# Patient Record
Sex: Male | Born: 1950 | Race: White | Hispanic: No | Marital: Married | Smoking: Current some day smoker
Health system: Southern US, Community
[De-identification: ages and names within clinical notes are randomized; demographics above are authoritative.]

## PROBLEM LIST (undated history)

## (undated) DIAGNOSIS — G4730 Sleep apnea, unspecified: Secondary | ICD-10-CM

## (undated) DIAGNOSIS — I10 Essential (primary) hypertension: Secondary | ICD-10-CM

## (undated) DIAGNOSIS — G4733 Obstructive sleep apnea (adult) (pediatric): Secondary | ICD-10-CM

## (undated) DIAGNOSIS — D126 Benign neoplasm of colon, unspecified: Secondary | ICD-10-CM

## (undated) DIAGNOSIS — D696 Thrombocytopenia, unspecified: Secondary | ICD-10-CM

## (undated) HISTORY — DX: Benign neoplasm of colon, unspecified: D12.6

## (undated) HISTORY — DX: Sleep apnea, unspecified: G47.30

## (undated) HISTORY — DX: Thrombocytopenia, unspecified: D69.6

## (undated) HISTORY — PX: MASTOIDECTOMY: SHX711

## (undated) HISTORY — DX: Essential (primary) hypertension: I10

## (undated) HISTORY — PX: KNEE ARTHROSCOPY: SUR90

## (undated) HISTORY — DX: Obstructive sleep apnea (adult) (pediatric): G47.33

## (undated) HISTORY — PX: TYMPANOSTOMY TUBE PLACEMENT: SHX32

---

## 1996-12-04 ENCOUNTER — Encounter: Payer: Self-pay | Admitting: Internal Medicine

## 1996-12-04 LAB — CONVERTED CEMR LAB: TSH: 1.3 microintl units/mL

## 1996-12-05 ENCOUNTER — Encounter: Payer: Self-pay | Admitting: Internal Medicine

## 1999-10-01 ENCOUNTER — Encounter: Payer: Self-pay | Admitting: Internal Medicine

## 1999-10-01 LAB — CONVERTED CEMR LAB: Blood Glucose, Fasting: 95 mg/dL

## 2001-02-23 ENCOUNTER — Encounter: Payer: Self-pay | Admitting: Internal Medicine

## 2002-09-11 ENCOUNTER — Encounter: Payer: Self-pay | Admitting: Internal Medicine

## 2004-01-22 ENCOUNTER — Encounter: Payer: Self-pay | Admitting: Internal Medicine

## 2004-01-22 LAB — CONVERTED CEMR LAB
Blood Glucose, Fasting: 101 mg/dL
TSH: 1.11 microintl units/mL

## 2004-03-03 ENCOUNTER — Encounter: Admission: RE | Admit: 2004-03-03 | Discharge: 2004-03-03 | Payer: Self-pay | Admitting: Cardiology

## 2005-04-23 IMAGING — CT CT ANGIO ABDOMEN
1 of 6 series · 12 of 32 positions shown, 18 images · IV contrast (150 ML OMNI 300)
Comparison: none

CLINICAL DATA: Malignant hypertension.  Con ? none. 
 CT ANGIO ABDOMEN WITH AND WITHOUT CONTRAST
 Comparison ? none.
TECHNIQUE: After the intravenous injection of 150 cc Omnipaque 350, 1 mm spiral images were obtained through the abdomen.  Noncontrasted 5 mm sections through the abdomen were also obtained.  Images were reconstructed in multiplanar views.

[Series 5: recon 2: renal/sma cta · axial · 0.55mm/px · z∈[-274,-19]mm · 12 of 485 slices shown, 18 images]
[im 38/485  soft-tissue]
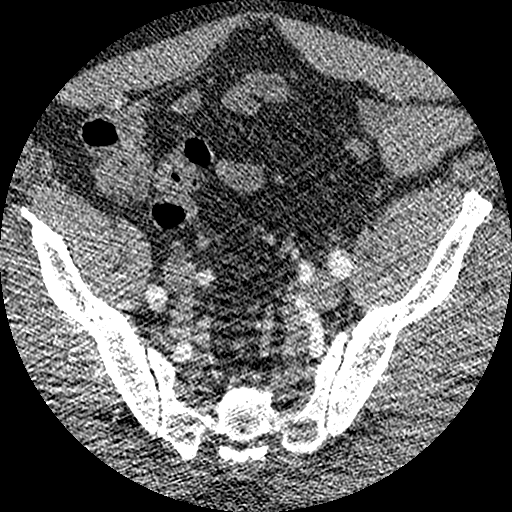
[im 38/485  bone]
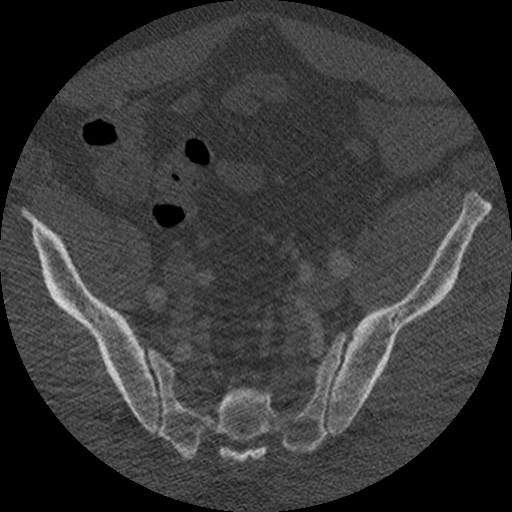
[im 75/485  soft-tissue]
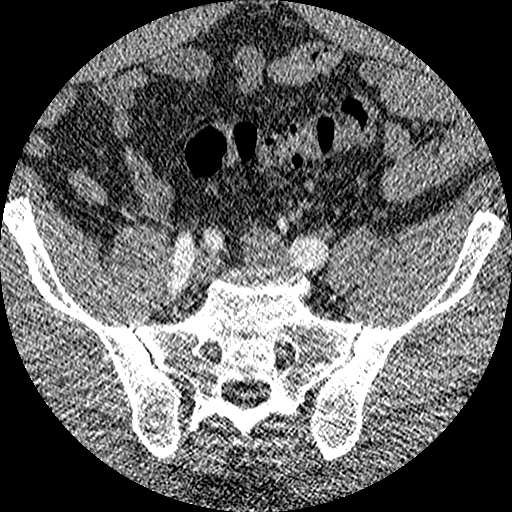
[im 112/485  soft-tissue]
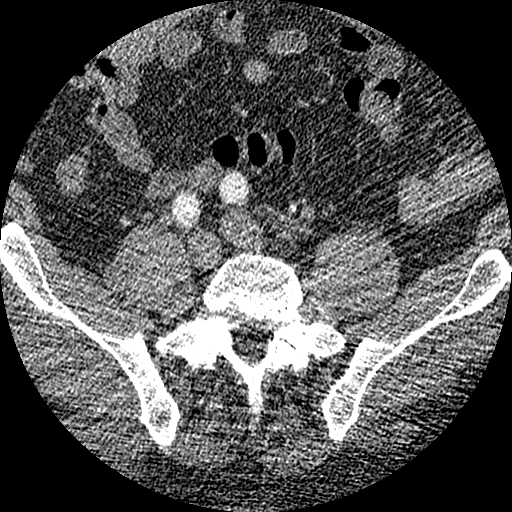
[im 149/485  soft-tissue]
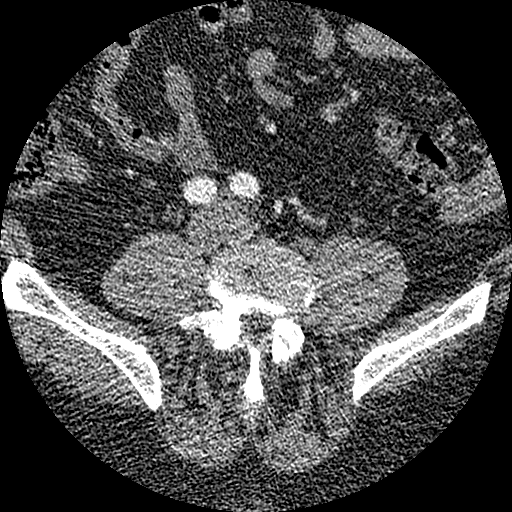
[im 187/485  soft-tissue]
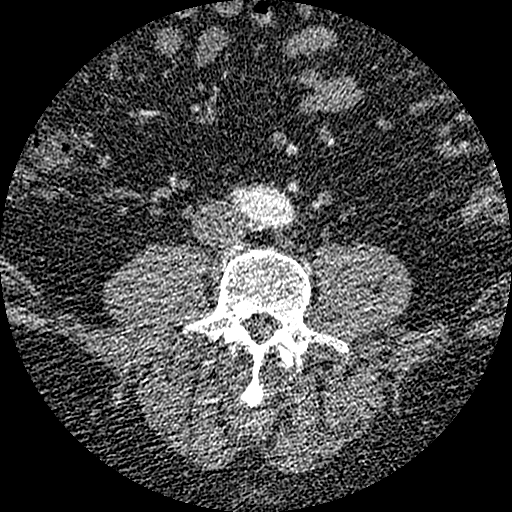
[im 224/485  soft-tissue]
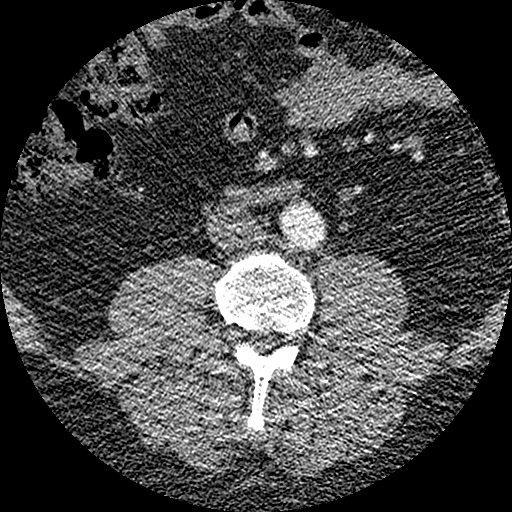
[im 261/485  soft-tissue]
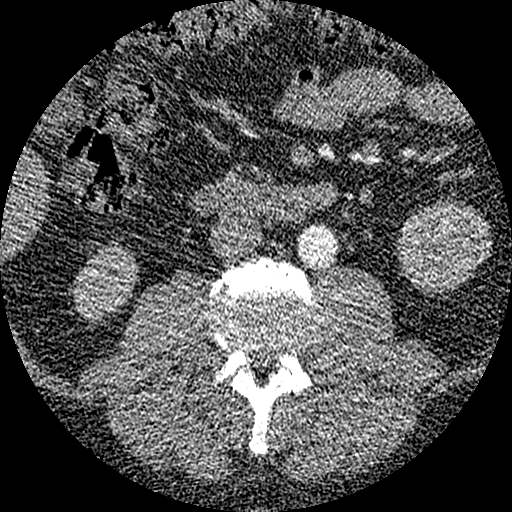
[im 298/485  soft-tissue]
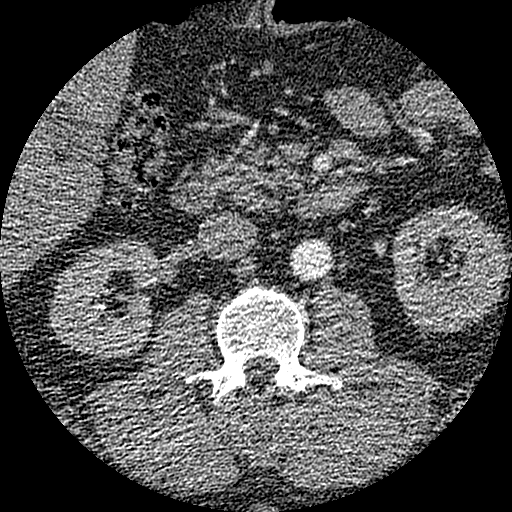
[im 336/485  soft-tissue]
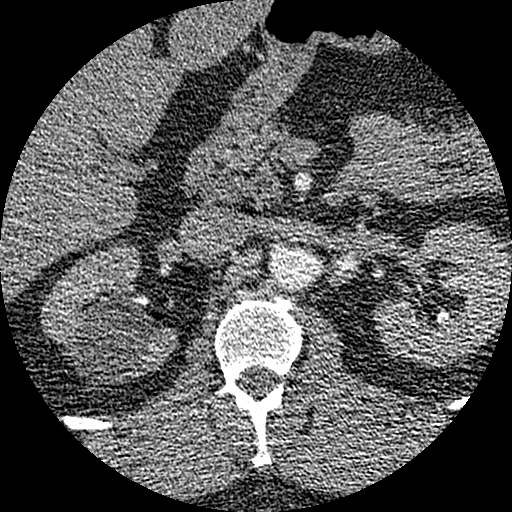
[im 336/485  lung]
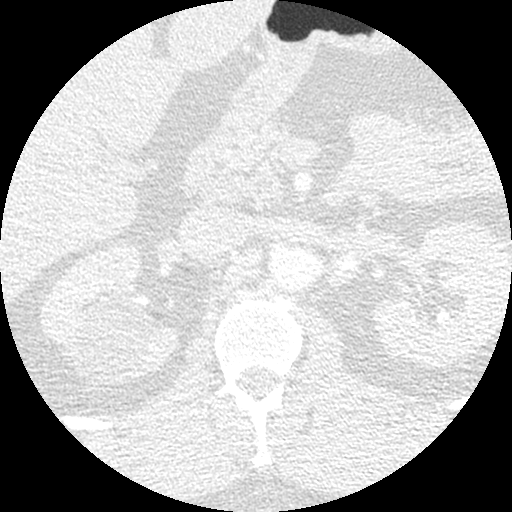
[im 336/485  bone]
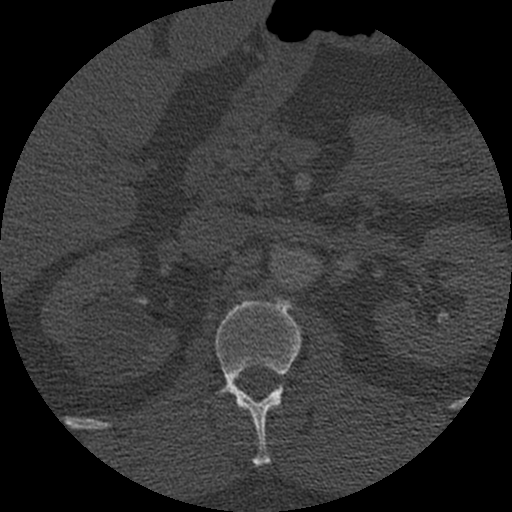
[im 373/485  soft-tissue]
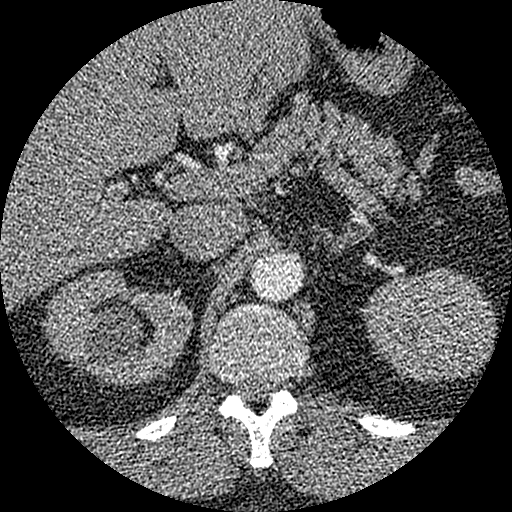
[im 373/485  lung]
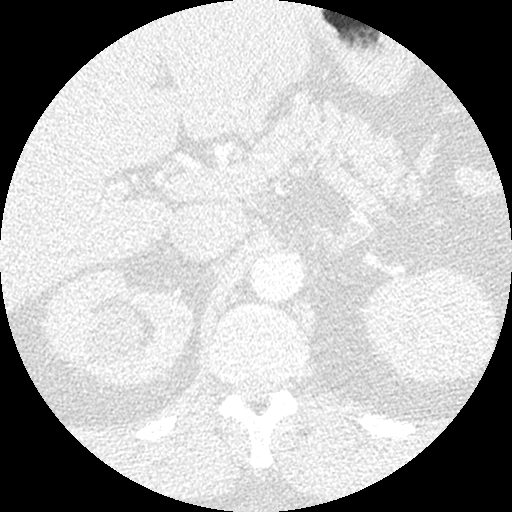
[im 410/485  soft-tissue]
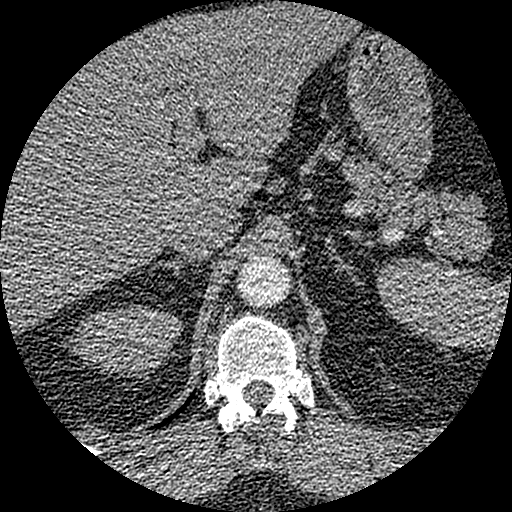
[im 410/485  lung]
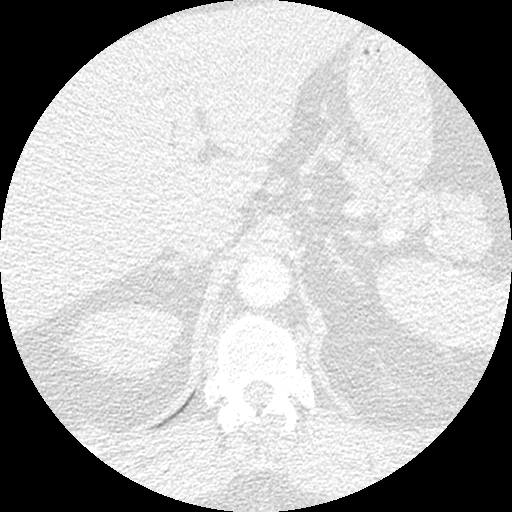
[im 447/485  soft-tissue]
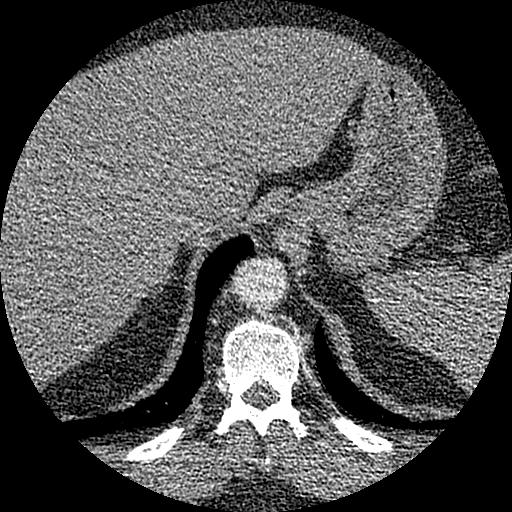
[im 447/485  lung]
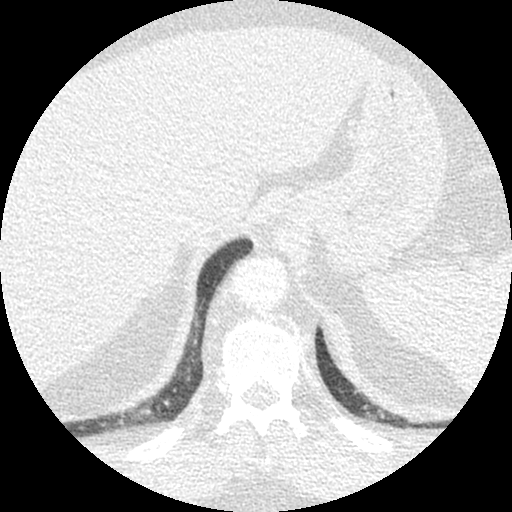

[12 of 32 positions shown; findings below may reference images not displayed]

FINDINGS: Single renal arteries are widely patent bilaterally.  The abdominal aorta is tortuous secondary to hypertension.  The celiac axis, SMA and IMA are patent.  The iliac arteries are patent.  
 Hypodensities are seen in both kidneys.  The largest is in the interpolar region of the right kidney measuring 5 x 4.7 cm.  This is compatible with a simple cyst.  Other hypodensities are too small to characterize.  The gallbladder, liver, spleen, pancreas and adrenal glands are within normal limits.  Borderline periportal lymph nodes are present likely inflammatory.  The appendix is normal.  Negative free fluid.  An umbilical hernia is present containing only peritoneal fat.  
 IMPRESSION
 1.  No evidence of significant renal artery stenosis to cause renovascular hypertension.
 2.  Renal hypodensities.  The largest is a 5 cm simple cyst in the interpolar region of the right kidney.
 3.  Small umbilical hernia containing only fat.

## 2005-06-11 ENCOUNTER — Ambulatory Visit: Payer: Self-pay | Admitting: Internal Medicine

## 2005-06-25 ENCOUNTER — Ambulatory Visit: Payer: Self-pay | Admitting: Internal Medicine

## 2005-06-25 LAB — CONVERTED CEMR LAB: Blood Glucose, Fasting: 112 mg/dL

## 2005-07-23 ENCOUNTER — Ambulatory Visit: Payer: Self-pay | Admitting: Internal Medicine

## 2005-09-08 ENCOUNTER — Ambulatory Visit: Payer: Self-pay | Admitting: Internal Medicine

## 2005-10-28 ENCOUNTER — Ambulatory Visit: Payer: Self-pay | Admitting: Internal Medicine

## 2005-10-28 LAB — CONVERTED CEMR LAB
Blood Glucose, Fasting: 106 mg/dL
PSA: 0.4 ng/mL

## 2007-08-19 ENCOUNTER — Ambulatory Visit: Payer: Self-pay | Admitting: Internal Medicine

## 2007-08-19 DIAGNOSIS — G4733 Obstructive sleep apnea (adult) (pediatric): Secondary | ICD-10-CM | POA: Insufficient documentation

## 2007-08-19 DIAGNOSIS — I10 Essential (primary) hypertension: Secondary | ICD-10-CM

## 2007-08-19 DIAGNOSIS — J309 Allergic rhinitis, unspecified: Secondary | ICD-10-CM | POA: Insufficient documentation

## 2007-08-19 LAB — CONVERTED CEMR LAB
BUN: 20 mg/dL (ref 6–23)
Chloride: 105 meq/L (ref 96–112)
Creatinine, Ser: 1.1 mg/dL (ref 0.4–1.5)
GFR calc non Af Amer: 74 mL/min
Glucose, Bld: 108 mg/dL — ABNORMAL HIGH (ref 70–99)
Phosphorus: 3.7 mg/dL (ref 2.3–4.6)
Potassium: 4.6 meq/L (ref 3.5–5.1)
Sodium: 138 meq/L (ref 135–145)

## 2007-09-12 ENCOUNTER — Ambulatory Visit: Payer: Self-pay | Admitting: Internal Medicine

## 2007-09-27 ENCOUNTER — Encounter: Payer: Self-pay | Admitting: Internal Medicine

## 2007-09-27 ENCOUNTER — Ambulatory Visit: Payer: Self-pay | Admitting: Internal Medicine

## 2008-12-20 ENCOUNTER — Telehealth: Payer: Self-pay | Admitting: Internal Medicine

## 2009-01-25 ENCOUNTER — Telehealth (INDEPENDENT_AMBULATORY_CARE_PROVIDER_SITE_OTHER): Payer: Self-pay | Admitting: *Deleted

## 2009-02-06 ENCOUNTER — Ambulatory Visit: Payer: Self-pay | Admitting: Internal Medicine

## 2009-02-08 LAB — CONVERTED CEMR LAB
AST: 27 units/L (ref 0–37)
Albumin: 3.8 g/dL (ref 3.5–5.2)
Alkaline Phosphatase: 51 units/L (ref 39–117)
BUN: 17 mg/dL (ref 6–23)
Basophils Absolute: 0 10*3/uL (ref 0.0–0.1)
Calcium: 9.2 mg/dL (ref 8.4–10.5)
Eosinophils Absolute: 0.1 10*3/uL (ref 0.0–0.7)
Eosinophils Relative: 1.3 % (ref 0.0–5.0)
Glucose, Bld: 92 mg/dL (ref 70–99)
HCT: 41 % (ref 39.0–52.0)
Lymphs Abs: 1.3 10*3/uL (ref 0.7–4.0)
MCHC: 33.6 g/dL (ref 30.0–36.0)
MCV: 91.9 fL (ref 78.0–100.0)
Monocytes Absolute: 0.6 10*3/uL (ref 0.1–1.0)
Monocytes Relative: 7.5 % (ref 3.0–12.0)
PSA: 0.46 ng/mL (ref 0.10–4.00)
Phosphorus: 2.5 mg/dL (ref 2.3–4.6)
Potassium: 4.5 meq/L (ref 3.5–5.1)
TSH: 0.99 microintl units/mL (ref 0.35–5.50)
Total Bilirubin: 0.7 mg/dL (ref 0.3–1.2)
Total Protein: 6.8 g/dL (ref 6.0–8.3)
WBC: 8.6 10*3/uL (ref 4.5–10.5)

## 2009-03-31 ENCOUNTER — Emergency Department: Payer: Self-pay | Admitting: Emergency Medicine

## 2009-05-07 ENCOUNTER — Telehealth: Payer: Self-pay | Admitting: Internal Medicine

## 2010-01-01 ENCOUNTER — Telehealth: Payer: Self-pay | Admitting: Internal Medicine

## 2010-01-06 ENCOUNTER — Encounter (INDEPENDENT_AMBULATORY_CARE_PROVIDER_SITE_OTHER): Payer: Self-pay | Admitting: *Deleted

## 2010-04-09 ENCOUNTER — Telehealth: Payer: Self-pay | Admitting: Internal Medicine

## 2010-06-16 ENCOUNTER — Ambulatory Visit: Payer: Self-pay | Admitting: Internal Medicine

## 2010-06-16 DIAGNOSIS — L570 Actinic keratosis: Secondary | ICD-10-CM | POA: Insufficient documentation

## 2010-06-17 LAB — CONVERTED CEMR LAB
ALT: 37 units/L (ref 0–53)
AST: 27 units/L (ref 0–37)
Basophils Absolute: 0 10*3/uL (ref 0.0–0.1)
Bilirubin, Direct: 0.1 mg/dL (ref 0.0–0.3)
GFR calc non Af Amer: 77.01 mL/min (ref >60)
Glucose, Bld: 98 mg/dL (ref 70–99)
HCT: 39.4 % (ref 39.0–52.0)
MCHC: 34.6 g/dL (ref 30.0–36.0)
MCV: 91.6 fL (ref 78.0–100.0)
Monocytes Absolute: 0.7 10*3/uL (ref 0.1–1.0)
Monocytes Relative: 13.4 % — ABNORMAL HIGH (ref 3.0–12.0)
Neutro Abs: 3 10*3/uL (ref 1.4–7.7)
Phosphorus: 2.3 mg/dL (ref 2.3–4.6)
RBC: 4.3 M/uL (ref 4.22–5.81)
TSH: 0.92 microintl units/mL (ref 0.35–5.50)
Total Bilirubin: 0.6 mg/dL (ref 0.3–1.2)
Total Protein: 6.5 g/dL (ref 6.0–8.3)
WBC: 5.1 10*3/uL (ref 4.5–10.5)

## 2010-10-28 NOTE — Progress Notes (Signed)
Summary: regarding diltiazem  Phone Note Call from Patient Call back at [phone]   Caller: Spouse Call For: Cindee Salt MD Summary of Call: Pt's insurance has changed and is now requiring 90 day scripts.  They wont cover diltiazem XT 180 mg's that the pt is now taking but they will cover plain diltiazem 90 mg's.  Wife is asking if this can be changed and the pt can take 2 two times a day.  If so, please send 90 day supply to [facility]. Initial call taken by: Lowella Petties CMA,  January 01, 2010 12:29 PM  Follow-up for Phone Call        okay to change to non ER and change to 90mg  two times a day   Okay to send #90 x 1 Needs PE  by 4-6 months--he is actually due in May Follow-up by: Cindee Salt MD,  January 01, 2010 1:54 PM  Additional Follow-up for Phone Call Additional follow up Details #1::        Left message at spouses' job for her to call back regarding Rx and appt.  Linde Gillis CMA [facility] Dull)  January 01, 2010 4:13 PM     New/Updated Medications: CARDIZEM 90 MG TABS (DILTIAZEM HCL) take one tablet by mouth twice daily Prescriptions: CARDIZEM 90 MG TABS (DILTIAZEM HCL) take one tablet by mouth twice daily  #90 x 1   Entered by:   Linde Gillis CMA (AAMA)   Authorized by:   Cindee Salt MD   Signed by:   Linde Gillis CMA (AAMA) on 01/02/2010   Method used:   Historical   RxID:   [id number] CARTIA XT 180 MG CP24 (DILTIAZEM HCL COATED BEADS) Take 1 tablet by mouth two times a day  #90 x 1   Entered by:   Linde Gillis CMA (AAMA)   Authorized by:   Cindee Salt MD   Signed by:   Linde Gillis CMA (AAMA) on 01/02/2010   Method used:   Electron[facility] to        [id number]       [address]       [place], [place]  [address]       Ph: [phone]       Fax: [phone]   RxID:   [id number]  [facility]ed [facility] [facility] to [facility] this Rx for Diltiazem 180mg , left message on voicemail of correct Rx, Diltiazem 90mg .  Linde Gillis CMA Duncan Dull)  January 02, 2010 11:46 AM Current Allergies (reviewed today): ! * BEE STINGS  Appended Document: regarding diltiazem Patient's spouse called back and I advised her as instructed.  She will have her husband call back to schedule physical when he gets back in town.    Appended Document: regarding diltiazem Diltiazem called to pharmacy as 90 mg, 2 tabs two times a day, quantity changed to 360 with one refill, changed in emr.

## 2010-10-28 NOTE — Assessment & Plan Note (Signed)
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 * Bee Stings  Past History:  Past medical, surgical, family and social histories (including risk factors) reviewed for relevance to current acute and chronic problems.  Past Medical History: Reviewed history from 08/19/2007 and no changes required. Allergic rhinitis Hypertension Obstructive sleep apnea  Past Surgical History: Reviewed history from 08/19/2007 and no changes required. 1963 Ear tubes and mastoid surgery x 4 1991 Cyst left temporal area 1993 Arthroscopy left knee  Family History: Reviewed history from 08/19/2007 and no changes required. Dad with circulation problems --had JRA. Now with gout Mom with osteoarthritis No CAD, HTN, DM No prostate or colon cancer  Social History: Reviewed history from 08/19/2007 and no changes required. 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   Neck:  supple, no masses, no thyromegaly, no carotid bruits, and no cervical lymphadenopathy.   Lungs:  normal respiratory effort, no intercostal retractions, no accessory muscle use, and normal breath sounds.   Heart:  normal rate, regular rhythm, no murmur, and no gallop.   Extremities:  no sig edema Skin:  actiinic on left forearm small skin tags Psych:  normally interactive, good eye contact, not anxious appearing, and not depressed appearing.     Impression & Recommendations:  Problem # 1:  HYPERTENSION (ICD-401.9) Assessment Improved  BP some better with weight loss no changes needed  The following medications were removed from the medication list:    Diltiazem Hcl 90 Mg Tabs (Diltiazem hcl) .Marland Kitchen... Take 2 tabs by mouth two times a day His updated medication list for this problem includes:    Cardizem 90 Mg Tabs (Diltiazem hcl) .Marland Kitchen... Take one tablet by mouth twice daily    Doxazosin Mesylate 8 Mg Tabs (Doxazosin mesylate) .Marland Kitchen... Take 1 tablet by mouth once a day    Atenolol 100 Mg Tabs (Atenolol) .Marland Kitchen... Take 1 tablet by mouth once a day    Lisinopril-hydrochlorothiazide 20-12.5 Mg Tabs (Lisinopril-hydrochlorothiazide) .Marland Kitchen... Take 1 tablet by mouth two times a day  BP today: 130/80 Prior BP: 140/90 (02/06/2009)  Labs Reviewed: K+: 4.5 (02/06/2009) Creat: : 1.1 (02/06/2009)     Orders: TLB-Renal Function Panel (  80069-RENAL) TLB-CBC Platelet - w/Differential (85025-CBCD) TLB-Hepatic/Liver Function Pnl (80076-HEPATIC) TLB-TSH (Thyroid Stimulating Hormone) (84443-TSH) Venipuncture (09811)  Problem # 2:  ACTINIC KERATOSIS (ICD-702.0) Assessment: New  liquid nitrogen Rx 40 seconds x 2  Orders: Cryotherapy/Destruction benign or premalignant lesion (1st lesion)  (17000)  Complete Medication List: 1)  Cardizem 90 Mg Tabs (Diltiazem hcl) .... Take one tablet by mouth twice daily 2)  Doxazosin Mesylate 8 Mg Tabs (Doxazosin  mesylate) .... Take 1 tablet by mouth once a day 3)  Atenolol 100 Mg Tabs (Atenolol) .... Take 1 tablet by mouth once a day 4)  Lisinopril-hydrochlorothiazide 20-12.5 Mg Tabs (Lisinopril-hydrochlorothiazide) .... Take 1 tablet by mouth two times a day 5)  Epipen 2-pak 0.3 Mg/0.24ml (1:1000) Devi (Epinephrine hcl (anaphylaxis)) .... As needed bee stings 6)  Cpap  .... Used nightly  Other Orders: TLB-PSA (Prostate Specific Antigen) (84153-PSA)  Patient Instructions: 1)  Please schedule a follow-up appointment in 1 year for physical Prescriptions: LISINOPRIL-HYDROCHLOROTHIAZIDE 20-12.5 MG TABS (LISINOPRIL-HYDROCHLOROTHIAZIDE) Take 1 tablet by mouth two times a day  #180 Tablet x 3   Entered and Authorized by:   Cindee Salt MD   Signed by:   Cindee Salt MD on 06/16/2010   Method used:   Electronically to        [id number]       [address]       [place], [place]  [address]       Ph: [phone]       Fax: [phone]   RxID:   [id number] ATENOLOL 100 MG TABS (ATENOLOL) Take 1 tablet by mouth once a day  #90 Tablet x 3   Entered and Authorized by:   Cindee Salt MD   Signed by:   Cindee Salt MD on 06/16/2010   Method used:   Electronically to        [id number]       [address]       [place], [place]  [address]       Ph: [phone]       Fax: [phone]   RxID:   [id number] DOXAZOSIN MESYLATE 8 MG TABS (DOXAZOSIN MESYLATE) Take 1 tablet by mouth once a day  #90 Tablet x 3   Entered and Authorized by:   Cindee Salt MD   Signed by:   Cindee Salt MD on 06/16/2010   Method used:   Electronically to        [id number]       [address]       [place], [place]  [address]       Ph: [phone]       Fax: [phone]   RxID:   [id number] CARDIZEM 90 MG TABS (DILTIAZEM HCL) take one tablet by mouth twice daily  #90 x 3   Entered and Authorized  by:   Cindee Salt MD   Signed by:   Cindee Salt MD on 06/16/2010   Method used:   Electronically to        [id number]       [address]       [place], [place]  [address]       Ph: [phone]       Fax: [phone]   RxID:   [id number]   Current Allergies (reviewed today): ! * BEE STINGS

## 2010-10-28 NOTE — Miscellaneous (Signed)
Summary: med list update- diltiazem  Medications Added DILTIAZEM HCL 90 MG TABS (DILTIAZEM HCL) take 2 tabs by mouth two times a day       Clinical Lists Changes  Medications: Changed medication from CARTIA XT 180 MG CP24 (DILTIAZEM HCL COATED BEADS) Take 1 tablet by mouth two times a day to DILTIAZEM HCL 90 MG TABS (DILTIAZEM HCL) take 2 tabs by mouth two times a day     Prior Medications: DOXAZOSIN MESYLATE 8 MG TABS (DOXAZOSIN MESYLATE) Take 1 tablet by mouth once a day DILTIAZEM HCL 90 MG TABS (DILTIAZEM HCL) take 2 tabs by mouth two times a day ATENOLOL 100 MG TABS (ATENOLOL) Take 1 tablet by mouth once a day LISINOPRIL-HYDROCHLOROTHIAZIDE 20-12.5 MG TABS (LISINOPRIL-HYDROCHLOROTHIAZIDE) Take 1 tablet by mouth two times a day CPAP () USED NIGHTLY EPIPEN 2-PAK 0.3 MG/0.3ML (1:1000)  DEVI (EPINEPHRINE HCL (ANAPHYLAXIS)) as needed BEE STINGS AMOXICILLIN-POT CLAVULANATE 875-125 MG TABS (AMOXICILLIN-POT CLAVULANATE) 1 two times a day with food for infection METHOCARBAMOL 750 MG TABS (METHOCARBAMOL) 1 tab three times a day as needed for muscle spasm CARDIZEM 90 MG TABS (DILTIAZEM HCL) take one tablet by mouth twice daily Current Allergies: ! * BEE STINGS

## 2010-10-28 NOTE — Progress Notes (Signed)
Summary: Needs appt  Phone Note Outgoing Call Call back at Home Phone [phone]   Call placed by: DeShannon Katrinka Blazing CMA Duncan Dull),  April 09, 2010 9:18 AM Call placed to: Patient Summary of Call: Calling pt to advised that I did his refills for 3 months and within that time he needs to schedule a follow-up visit, pt was last seen 02/06/2009 and on that visit he was advised by Dr. Alphonsus Sias to follow-up in 1 year. Initial call taken by: Mervin Hack CMA Duncan Dull),  April 09, 2010 9:23 AM  Follow-up for Phone Call        left message on machine that patient needs to schedule appt. Follow-up by: Mervin Hack CMA Duncan Dull),  April 09, 2010 9:24 AM

## 2011-01-12 ENCOUNTER — Other Ambulatory Visit: Payer: Self-pay | Admitting: Internal Medicine

## 2011-07-27 ENCOUNTER — Other Ambulatory Visit: Payer: Self-pay | Admitting: Internal Medicine

## 2011-11-30 ENCOUNTER — Other Ambulatory Visit: Payer: Self-pay | Admitting: *Deleted

## 2011-11-30 MED ORDER — DILTIAZEM HCL 90 MG PO TABS: 90.0000 mg | ORAL_TABLET | Freq: Two times a day (BID) | ORAL | Status: DC

## 2011-11-30 MED ORDER — LISINOPRIL-HYDROCHLOROTHIAZIDE 20-12.5 MG PO TABS: 1.0000 | ORAL_TABLET | Freq: Two times a day (BID) | ORAL | Status: DC

## 2011-11-30 MED ORDER — ATENOLOL 100 MG PO TABS: 100.0000 mg | ORAL_TABLET | Freq: Every day | ORAL | Status: DC

## 2011-11-30 MED ORDER — DOXAZOSIN MESYLATE 8 MG PO TABS: 8.0000 mg | ORAL_TABLET | Freq: Every day | ORAL | Status: DC

## 2011-11-30 NOTE — Telephone Encounter (Signed)
Patient not seen since 05-2010. Is it okay to refill?

## 2011-11-30 NOTE — Telephone Encounter (Signed)
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

## 2011-11-30 NOTE — Telephone Encounter (Signed)
Patient walked in asking for refills, I advised I could fill for 2 months per Dr. Alphonsus Sias instructions and that he needed to schedule an appt, pt was very angry because he couldn't get through on the phone and because we can't fill more than 2 mths. I advised that he hasn't been seen since 2011 and we can't continue to fill medications without an OV. Pt stated he was going out of town tomorrow and will try and make an appt sometime.

## 2011-11-30 NOTE — Telephone Encounter (Signed)
.  left message to have patient return my call.  

## 2011-11-30 NOTE — Telephone Encounter (Signed)
He does this every time His passive aggressiveness is not appreciated He really needs appt!!

## 2012-02-12 ENCOUNTER — Other Ambulatory Visit: Payer: Self-pay | Admitting: *Deleted

## 2012-02-12 MED ORDER — DILTIAZEM HCL 90 MG PO TABS: 90.0000 mg | ORAL_TABLET | Freq: Two times a day (BID) | ORAL | Status: DC

## 2012-02-12 MED ORDER — DOXAZOSIN MESYLATE 8 MG PO TABS: 8.0000 mg | ORAL_TABLET | Freq: Every day | ORAL | Status: DC

## 2012-02-12 NOTE — Telephone Encounter (Signed)
Faxed request asking for refills and per Dr.Letvak on the 11/30/11 phone note, pt needs to be seen for further refills. Pt was told this in person when he walked in to our office.

## 2012-02-12 NOTE — Telephone Encounter (Signed)
Spoke with patient and scheduled CPX on 04/28/2012, sent in enough refills to last until then.

## 2012-03-21 ENCOUNTER — Other Ambulatory Visit: Payer: Self-pay | Admitting: *Deleted

## 2012-03-21 MED ORDER — LISINOPRIL-HYDROCHLOROTHIAZIDE 20-12.5 MG PO TABS: 1.0000 | ORAL_TABLET | Freq: Two times a day (BID) | ORAL | Status: DC

## 2012-03-21 MED ORDER — ATENOLOL 100 MG PO TABS: 100.0000 mg | ORAL_TABLET | Freq: Every day | ORAL | Status: DC

## 2012-04-28 ENCOUNTER — Encounter: Payer: Self-pay | Admitting: Internal Medicine

## 2012-04-28 ENCOUNTER — Ambulatory Visit (INDEPENDENT_AMBULATORY_CARE_PROVIDER_SITE_OTHER): Payer: 59 | Admitting: Internal Medicine

## 2012-04-28 VITALS — BP 116/78 | HR 60 | Temp 98.2°F | Ht 71.5 in | Wt 342.0 lb

## 2012-04-28 DIAGNOSIS — Z0000 Encounter for general adult medical examination without abnormal findings: Secondary | ICD-10-CM | POA: Insufficient documentation

## 2012-04-28 DIAGNOSIS — I10 Essential (primary) hypertension: Secondary | ICD-10-CM

## 2012-04-28 MED ORDER — DOXAZOSIN MESYLATE 8 MG PO TABS: 8.0000 mg | ORAL_TABLET | Freq: Every day | ORAL | Status: DC

## 2012-04-28 MED ORDER — DILTIAZEM HCL 90 MG PO TABS: 90.0000 mg | ORAL_TABLET | Freq: Two times a day (BID) | ORAL | Status: DC

## 2012-04-28 MED ORDER — LISINOPRIL-HYDROCHLOROTHIAZIDE 20-12.5 MG PO TABS: 1.0000 | ORAL_TABLET | Freq: Two times a day (BID) | ORAL | Status: DC

## 2012-04-28 MED ORDER — ATENOLOL 100 MG PO TABS: 100.0000 mg | ORAL_TABLET | Freq: Every day | ORAL | Status: DC

## 2012-04-28 NOTE — Progress Notes (Signed)
Subjective:    Patient ID: Marcus Williamson, male    DOB: 17-May-1951, 61 y.o.   MRN: [id number]  HPI Here for physical Mild rotator cuff symptoms on right  Strain of left elbow No clear injuries but has to secure loads on flatbed--lots of physical strain with this Using tylenol and ibuprofen with some success  Same BP meds No problems with this  Current Outpatient Prescriptions on File Prior to Visit  Medication Sig Dispense Refill  . DISCONTD: atenolol (TENORMIN) 100 MG tablet Take 1 tablet (100 mg total) by mouth daily.  30 tablet  0  . DISCONTD: diltiazem (CARDIZEM) 90 MG tablet Take 1 tablet (90 mg total) by mouth 2 (two) times daily.  60 tablet  3  . DISCONTD: doxazosin (CARDURA) 8 MG tablet Take 1 tablet (8 mg total) by mouth at bedtime.  30 tablet  3  . DISCONTD: lisinopril-hydrochlorothiazide (PRINZIDE,ZESTORETIC) 20-12.5 MG per tablet Take 1 tablet by mouth 2 (two) times daily.  60 tablet  0    No Known Allergies  No past medical history on file.  No past surgical history on file.  No family history on file.  History   Social History  . Marital Status: Married    Spouse Name: N/A    Number of Children: N/A  . Years of Education: N/A   Occupational History  . Not on file.   Social History Main Topics  . Smoking status: Current Some Day Smoker  . Smokeless tobacco: Not on file  . Alcohol Use: Yes  . Drug Use: No  . Sexually Active: Not on file   Other Topics Concern  . Not on file   Social History Narrative  . No narrative on file   Review of Systems  Constitutional: Positive for unexpected weight change. Negative for fatigue.       Wears seat belt Gained back 30#, then lost 10# again  HENT: Positive for hearing loss and tinnitus. Negative for congestion and rhinorrhea.        Chronic tinnitus and hearing loss Regular with dentist  Eyes: Negative for visual disturbance.       No diplopia or unilateral vision loss  Respiratory: Negative for cough,  chest tightness and shortness of breath.   Cardiovascular: Negative for chest pain, palpitations and leg swelling.  Gastrointestinal: Negative for nausea, vomiting, abdominal pain, constipation and blood in stool.       No recent heartburn problems---had 30 years ago but resolved  Genitourinary: Negative for dysuria, urgency, frequency and difficulty urinating.       No sexual problems  Musculoskeletal: Positive for arthralgias. Negative for back pain and joint swelling.  Skin: Negative for rash.       No suspicious lesions  Neurological: Positive for numbness. Negative for dizziness, syncope, weakness, light-headedness and headaches.       Slight numbness in left great toe---chronic  Hematological: Negative for adenopathy. Does not bruise/bleed easily.  Psychiatric/Behavioral: Negative for disturbed wake/sleep cycle and dysphoric mood. The patient is not nervous/anxious.        Sleeps well with CPAP       Objective:   Physical Exam  Constitutional: He is oriented to person, place, and time. He appears well-developed and well-nourished. No distress.  HENT:  Head: Normocephalic and atraumatic.  Right Ear: External ear normal.  Left Ear: External ear normal.  Mouth/Throat: Oropharynx is clear and moist.  Eyes: Conjunctivae and EOM are normal. Pupils are equal, round, and reactive to light.  Neck: Normal range of motion. Neck supple. No thyromegaly present.  Cardiovascular: Normal rate, regular rhythm, normal heart sounds and intact distal pulses.  Exam reveals no gallop.   No murmur heard. Pulmonary/Chest: Effort normal and breath sounds normal. No respiratory distress. He has no wheezes. He has no rales.  Abdominal: Soft. There is no tenderness.  Musculoskeletal: He exhibits no edema and no tenderness.       Can abduct right shoulder to 90 degrees Mild restriction in internal and external rotation  Lymphadenopathy:    He has no cervical adenopathy.  Neurological: He is alert and  oriented to person, place, and time.  Skin: No rash noted. No erythema.  Psychiatric: His behavior is normal.          Assessment & Plan:

## 2012-04-28 NOTE — Assessment & Plan Note (Signed)
BP Readings from Last 3 Encounters:  04/28/12 116/78  06/16/10 130/80  02/06/09 140/90   Good control No changes needed

## 2012-04-28 NOTE — Assessment & Plan Note (Addendum)
Fairly healthy Discussed fitness No PSA after discussion Will be due for colonoscopy at end of year Will check cholesterol levels but no Rx by his choice regardless of level

## 2012-04-28 NOTE — Assessment & Plan Note (Signed)
Discussed lifestyle changes.

## 2012-04-29 LAB — HEPATIC FUNCTION PANEL
ALT: 51 U/L (ref 0–53)
AST: 32 U/L (ref 0–37)
Total Bilirubin: 0.4 mg/dL (ref 0.3–1.2)

## 2012-04-29 LAB — CBC WITH DIFFERENTIAL/PLATELET
Basophils Absolute: 0 10*3/uL (ref 0.0–0.1)
Basophils Relative: 0.8 % (ref 0.0–3.0)
Eosinophils Absolute: 0.1 10*3/uL (ref 0.0–0.7)
Eosinophils Relative: 3.9 % (ref 0.0–5.0)
HCT: 43.1 % (ref 39.0–52.0)
Lymphocytes Relative: 46 % (ref 12.0–46.0)
MCHC: 33.8 g/dL (ref 30.0–36.0)
MCV: 92.5 fl (ref 78.0–100.0)

## 2012-04-29 LAB — LIPID PANEL
HDL: 44 mg/dL (ref >39.00)
Total CHOL/HDL Ratio: 4
Triglycerides: 114 mg/dL (ref 0.0–149.0)
VLDL: 22.8 mg/dL (ref 0.0–40.0)

## 2012-04-29 LAB — BASIC METABOLIC PANEL
CO2: 25 mEq/L (ref 19–32)
Calcium: 9.8 mg/dL (ref 8.4–10.5)
Chloride: 105 mEq/L (ref 96–112)
Glucose, Bld: 97 mg/dL (ref 70–99)
Potassium: 4.6 mEq/L (ref 3.5–5.1)

## 2012-04-29 LAB — TSH: TSH: 0.74 u[IU]/mL (ref 0.35–5.50)

## 2012-05-03 ENCOUNTER — Encounter: Payer: Self-pay | Admitting: *Deleted

## 2012-07-27 ENCOUNTER — Other Ambulatory Visit: Payer: Self-pay

## 2012-07-27 NOTE — Telephone Encounter (Signed)
[facility] sent refill request for [facility]enolol; spoke with [facility] already has refills available.

## 2012-10-05 ENCOUNTER — Encounter: Payer: Self-pay | Admitting: Internal Medicine

## 2013-04-13 ENCOUNTER — Other Ambulatory Visit: Payer: Self-pay | Admitting: Internal Medicine

## 2013-05-06 ENCOUNTER — Other Ambulatory Visit: Payer: Self-pay | Admitting: Internal Medicine

## 2013-05-24 ENCOUNTER — Encounter: Payer: Self-pay | Admitting: Internal Medicine

## 2013-08-03 ENCOUNTER — Other Ambulatory Visit: Payer: Self-pay | Admitting: Internal Medicine

## 2013-08-03 NOTE — Telephone Encounter (Signed)
Patient last seen 04/2012, no future appointments scheduled, ok to fill?

## 2013-08-03 NOTE — Telephone Encounter (Signed)
Can fill only 1 month of each locally No more until he comes in---he does this every year

## 2013-08-04 NOTE — Telephone Encounter (Signed)
rx sent to pharmacy by e-script .left message to have patient return my call.  

## 2013-08-31 ENCOUNTER — Other Ambulatory Visit: Payer: Self-pay | Admitting: Internal Medicine

## 2013-09-27 ENCOUNTER — Ambulatory Visit (INDEPENDENT_AMBULATORY_CARE_PROVIDER_SITE_OTHER): Payer: 59 | Admitting: Internal Medicine

## 2013-09-27 ENCOUNTER — Encounter: Payer: Self-pay | Admitting: Internal Medicine

## 2013-09-27 VITALS — BP 160/90 | HR 50 | Temp 97.9°F | Wt 351.0 lb

## 2013-09-27 DIAGNOSIS — I10 Essential (primary) hypertension: Secondary | ICD-10-CM

## 2013-09-27 DIAGNOSIS — G4733 Obstructive sleep apnea (adult) (pediatric): Secondary | ICD-10-CM

## 2013-09-27 DIAGNOSIS — Z125 Encounter for screening for malignant neoplasm of prostate: Secondary | ICD-10-CM

## 2013-09-27 LAB — CBC WITH DIFFERENTIAL/PLATELET
Basophils Absolute: 0 10*3/uL (ref 0.0–0.1)
Basophils Relative: 0.6 % (ref 0.0–3.0)
Eosinophils Absolute: 0.1 10*3/uL (ref 0.0–0.7)
Eosinophils Relative: 3 % (ref 0.0–5.0)
HCT: 42.2 % (ref 39.0–52.0)
Hemoglobin: 14.3 g/dL (ref 13.0–17.0)
Lymphocytes Relative: 31.8 % (ref 12.0–46.0)
Lymphs Abs: 1.4 10*3/uL (ref 0.7–4.0)
MCHC: 33.8 g/dL (ref 30.0–36.0)
MCV: 91 fl (ref 78.0–100.0)
Monocytes Absolute: 0.6 10*3/uL (ref 0.1–1.0)
Monocytes Relative: 13.2 % — ABNORMAL HIGH (ref 3.0–12.0)
Neutrophils Relative %: 51.4 % (ref 43.0–77.0)
Platelets: 155 10*3/uL (ref 150.0–400.0)
RBC: 4.64 Mil/uL (ref 4.22–5.81)
WBC: 4.3 10*3/uL — ABNORMAL LOW (ref 4.5–10.5)

## 2013-09-27 LAB — BASIC METABOLIC PANEL
BUN: 21 mg/dL (ref 6–23)
CO2: 29 mEq/L (ref 19–32)
Chloride: 104 mEq/L (ref 96–112)
Creatinine, Ser: 1 mg/dL (ref 0.4–1.5)
GFR: 79.65 mL/min (ref >60.00)
Potassium: 4.4 mEq/L (ref 3.5–5.1)
Sodium: 139 mEq/L (ref 135–145)

## 2013-09-27 LAB — LIPID PANEL
Cholesterol: 196 mg/dL (ref 0–200)
Triglycerides: 78 mg/dL (ref 0.0–149.0)
VLDL: 15.6 mg/dL (ref 0.0–40.0)

## 2013-09-27 LAB — PSA: PSA: 0.4 ng/mL (ref 0.10–4.00)

## 2013-09-27 LAB — HEPATIC FUNCTION PANEL
ALT: 53 U/L (ref 0–53)
AST: 31 U/L (ref 0–37)
Albumin: 4.4 g/dL (ref 3.5–5.2)
Total Bilirubin: 0.6 mg/dL (ref 0.3–1.2)

## 2013-09-27 MED ORDER — DOXAZOSIN MESYLATE 8 MG PO TABS: 8.0000 mg | ORAL_TABLET | Freq: Every day | ORAL | Status: DC

## 2013-09-27 MED ORDER — LISINOPRIL-HYDROCHLOROTHIAZIDE 20-12.5 MG PO TABS: 1.0000 | ORAL_TABLET | Freq: Two times a day (BID) | ORAL | Status: DC

## 2013-09-27 MED ORDER — ZOSTER VACCINE LIVE 19400 UNT/0.65ML ~~LOC~~ SOLR: 0.6500 mL | Freq: Once | SUBCUTANEOUS | Status: DC

## 2013-09-27 MED ORDER — DILTIAZEM HCL 90 MG PO TABS: 90.0000 mg | ORAL_TABLET | Freq: Two times a day (BID) | ORAL | Status: DC

## 2013-09-27 MED ORDER — ATENOLOL 100 MG PO TABS: 100.0000 mg | ORAL_TABLET | Freq: Every day | ORAL | Status: DC

## 2013-09-27 NOTE — Progress Notes (Signed)
Pre-visit discussion using our clinic review tool. No additional management support is needed unless otherwise documented below in the visit note.  

## 2013-09-27 NOTE — Progress Notes (Signed)
   Subjective:    Patient ID: Marcus Williamson, male    DOB: May 05, 1951, 62 y.o.   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 atenolol (TENORMIN) 100 MG tablet TAKE 1 TABLET BY MOUTH EVERY DAY -- NEEDS APPT FOR MORE REFILLS  30 tablet  0  . diltiazem (CARDIZEM) 90 MG tablet TAKE 1 TABLET BY MOUTH TWICE A DAY -- NEED APPT FOR MORE REFILLS  60 tablet  0  . doxazosin (CARDURA) 8 MG tablet TAKE 1 TABLET BY MOUTH AT BEDTIME -- NEEDS APPT FOR MORE REFILLS  30 tablet  0  . lisinopril-hydrochlorothiazide (PRINZIDE,ZESTORETIC) 20-12.5 MG per tablet TAKE 1 TABLET BY MOUTH TWICE A DAY -- NEEDS APPT FOR MORE REFILLS  60 tablet  0   No current facility-administered medications on file prior to visit.    No Known Allergies  No past medical history on file.  No past surgical history on file.  No family history on file.  History   Social History  . Marital Status: Married    Spouse Name: N/A    Number of Children: N/A  . Years of Education: N/A   Occupational History  . Not on file.   Social History Main Topics  . Smoking status: Current Some Day Smoker  . Smokeless tobacco: Never Used  . Alcohol Use: Yes  . Drug Use: No  . Sexual Activity: Not on file   Other Topics Concern  . Not on file   Social History Narrative  . No narrative on file   Review of Systems Bowels are fine No trouble voiding No recent trouble with allergies--will use OTC med prn     Objective:   Physical Exam  Constitutional: He appears well-developed and  well-nourished. No distress.  Neck: Normal range of motion. Neck supple. No thyromegaly present.  Cardiovascular: Normal rate, regular rhythm, normal heart sounds and intact distal pulses.  Exam reveals no gallop.   No murmur heard. Pulmonary/Chest: Effort normal and breath sounds normal. No respiratory distress. He has no wheezes. He has no rales.  Musculoskeletal: He exhibits no edema and no tenderness.  Lymphadenopathy:    He has no cervical adenopathy.  Psychiatric: He has a normal mood and affect. His behavior is normal.          Assessment & Plan:

## 2013-09-27 NOTE — Assessment & Plan Note (Signed)
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

## 2013-09-27 NOTE — Patient Instructions (Signed)
DASH Diet The DASH diet stands for "Dietary Approaches to Stop Hypertension." It is a healthy eating plan that has been shown to reduce high blood pressure (hypertension) in as little as 14 days, while also possibly providing other significant health benefits. These other health benefits include reducing the risk of breast cancer after menopause and reducing the risk of type 2 diabetes, heart disease, colon cancer, and stroke. Health benefits also include weight loss and slowing kidney failure in patients with chronic kidney disease.  DIET GUIDELINES  Limit salt (sodium). Your diet should contain less than 1500 mg of sodium daily.  Limit refined or processed carbohydrates. Your diet should include mostly whole grains. Desserts and added sugars should be used sparingly.  Include small amounts of heart-healthy fats. These types of fats include nuts, oils, and tub margarine. Limit saturated and trans fats. These fats have been shown to be harmful in the body. CHOOSING FOODS  The following food groups are based on a 2000 calorie diet. See your Registered Dietitian for individual calorie needs. Grains and Grain Products (6 to 8 servings daily)  Eat More Often: Whole-wheat bread, brown rice, whole-grain or wheat pasta, quinoa, popcorn without added fat or salt (air popped).  Eat Less Often: White bread, white pasta, white rice, cornbread. Vegetables (4 to 5 servings daily)  Eat More Often: Fresh, frozen, and canned vegetables. Vegetables may be raw, steamed, roasted, or grilled with a minimal amount of fat.  Eat Less Often/Avoid: Creamed or fried vegetables. Vegetables in a cheese sauce. Fruit (4 to 5 servings daily)  Eat More Often: All fresh, canned (in natural juice), or frozen fruits. Dried fruits without added sugar. One hundred percent fruit juice ( cup [237 mL] daily).  Eat Less Often: Dried fruits with added sugar. Canned fruit in light or heavy syrup. Lean Meats, Fish, and Poultry (2  servings or less daily. One serving is 3 to 4 oz [85-114 g]).  Eat More Often: Ninety percent or leaner ground beef, tenderloin, sirloin. Round cuts of beef, chicken breast, turkey breast. All fish. Grill, bake, or broil your meat. Nothing should be fried.  Eat Less Often/Avoid: Fatty cuts of meat, turkey, or chicken leg, thigh, or wing. Fried cuts of meat or fish. Dairy (2 to 3 servings)  Eat More Often: Low-fat or fat-free milk, low-fat plain or light yogurt, reduced-fat or part-skim cheese.  Eat Less Often/Avoid: Milk (whole, 2%).Whole milk yogurt. Full-fat cheeses. Nuts, Seeds, and Legumes (4 to 5 servings per week)  Eat More Often: All without added salt.  Eat Less Often/Avoid: Salted nuts and seeds, canned beans with added salt. Fats and Sweets (limited)  Eat More Often: Vegetable oils, tub margarines without trans fats, sugar-free gelatin. Mayonnaise and salad dressings.  Eat Less Often/Avoid: Coconut oils, palm oils, butter, stick margarine, cream, half and half, cookies, candy, pie. FOR MORE INFORMATION The Dash Diet Eating Plan: www.dashdiet.org Document Released: 09/03/2011 Document Revised: 12/07/2011 Document Reviewed: 09/03/2011 ExitCare Patient Information 2014 [employer]. Exercise to Lose Weight Exercise and a healthy diet may help you lose weight. Your doctor may suggest specific exercises. EXERCISE IDEAS AND TIPS  Choose low-cost things you enjoy doing, such as walking, bicycling, or exercising to workout videos.  Take stairs instead of the elevator.  Walk during your lunch break.  Park your car further away from work or school.  Go to a gym or an exercise class.  Start with 5 to 10 minutes of exercise each day. Build up to 30 minutes   of exercise 4 to 6 days a week.  Wear shoes with good support and comfortable clothes.  Stretch before and after working out.  Work out until you breathe harder and your heart beats faster.  Drink extra water when  you exercise.  Do not do so much that you hurt yourself, feel dizzy, or get very short of breath. Exercises that burn about 150 calories:  Running 1  miles in 15 minutes.  Playing volleyball for 45 to 60 minutes.  Washing and waxing a car for 45 to 60 minutes.  Playing touch football for 45 minutes.  Walking 1  miles in 35 minutes.  Pushing a stroller 1  miles in 30 minutes.  Playing basketball for 30 minutes.  Raking leaves for 30 minutes.  Bicycling 5 miles in 30 minutes.  Walking 2 miles in 30 minutes.  Dancing for 30 minutes.  Shoveling snow for 15 minutes.  Swimming laps for 20 minutes.  Walking up stairs for 15 minutes.  Bicycling 4 miles in 15 minutes.  Gardening for 30 to 45 minutes.  Jumping rope for 15 minutes.  Washing windows or floors for 45 to 60 minutes. Document Released: 10/17/2010 Document Revised: 12/07/2011 Document Reviewed: 10/17/2010 Exit[employer] Patient Information 2014 [employer].  

## 2013-09-27 NOTE — Assessment & Plan Note (Signed)
Doing well on his CPAP

## 2013-09-27 NOTE — Addendum Note (Signed)
Addended by: Sueanne Margarita on: 09/27/2013 01:53 PM   Modules accepted: Orders

## 2013-09-27 NOTE — Assessment & Plan Note (Signed)
Gave info He can do better but he has to try

## 2013-10-03 ENCOUNTER — Encounter: Payer: Self-pay | Admitting: *Deleted

## 2013-12-25 ENCOUNTER — Ambulatory Visit (INDEPENDENT_AMBULATORY_CARE_PROVIDER_SITE_OTHER): Payer: 59 | Admitting: Internal Medicine

## 2013-12-25 ENCOUNTER — Encounter: Payer: Self-pay | Admitting: Internal Medicine

## 2013-12-25 VITALS — BP 140/90 | HR 63 | Temp 98.0°F | Wt 355.0 lb

## 2013-12-25 DIAGNOSIS — I10 Essential (primary) hypertension: Secondary | ICD-10-CM

## 2013-12-25 DIAGNOSIS — R109 Unspecified abdominal pain: Secondary | ICD-10-CM

## 2013-12-25 DIAGNOSIS — G4733 Obstructive sleep apnea (adult) (pediatric): Secondary | ICD-10-CM

## 2013-12-25 NOTE — Patient Instructions (Signed)
Please call 1-800 QUIT NOW for help to stop smoking.

## 2013-12-25 NOTE — Assessment & Plan Note (Signed)
BP Readings from Last 3 Encounters:  12/25/13 140/90  09/27/13 160/90  04/28/12 116/78   Better today No changes

## 2013-12-25 NOTE — Progress Notes (Signed)
Pre visit review using our clinic review tool, if applicable. No additional management support is needed unless otherwise documented below in the visit note. 

## 2013-12-25 NOTE — Progress Notes (Signed)
   Subjective:    Patient ID: Marcus Williamson, male    DOB: 02-09-1951, 63 y.o.   MRN: [id number]  HPI Twisted left knee on snow and ice Taking tylenol and ibuprofen to get through the day--often 1 hour before he knew he had the physical portion of his work (with the straps, etc) Started with severe right flank pain about 2 weeks ago Within 2 days, he also felt the pain in right testicle Worsened last week Aggravated by bouncing around in the truck  About 8-9 days ago--he noticed surface tenderness along the same right flank This has continued Thought he may need ER visit on the road Taking tylenol and ibuprofen regularly---helps briefly Wonders if this is from his known large ventral hernia No relationship to eating---notes it mostly when bouncing around in truck  Gets full sensation in upper chest after eating Able to swallow okay No heartburn  Current Outpatient Prescriptions on File Prior to Visit  Medication Sig Dispense Refill  . atenolol (TENORMIN) 100 MG tablet Take 1 tablet (100 mg total) by mouth daily.  90 tablet  3  . diltiazem (CARDIZEM) 90 MG tablet Take 1 tablet (90 mg total) by mouth 2 (two) times daily.  180 tablet  3  . doxazosin (CARDURA) 8 MG tablet Take 1 tablet (8 mg total) by mouth daily.  90 tablet  3  . lisinopril-hydrochlorothiazide (PRINZIDE,ZESTORETIC) 20-12.5 MG per tablet Take 1 tablet by mouth 2 (two) times daily.  180 tablet  3   No current facility-administered medications on file prior to visit.    No Known Allergies  No past medical history on file.  No past surgical history on file.  No family history on file.  History   Social History  . Marital Status: Married    Spouse Name: N/A    Number of Children: N/A  . Years of Education: N/A   Occupational History  . Not on file.   Social History Main Topics  . Smoking status: Current Some Day Smoker  . Smokeless tobacco: Never Used  . Alcohol Use: Yes  . Drug Use: No  . Sexual  Activity: Not on file   Other Topics Concern  . Not on file   Social History Narrative  . No narrative on file   Review of Systems Lots of belching and passing gas Bowels okay Appetite is okay    Objective:   Physical Exam  Constitutional: He appears well-developed. No distress.  Abdominal: Soft. He exhibits no distension and no mass. There is no tenderness. There is no rebound and no guarding.  Large midline ventral hernia Very small defect at umbilicus but no hernia  Genitourinary:  No inguinal hernia or scrotal mass  Musculoskeletal:  No spine tenderness No back tenderness  Skin:  No rash          Assessment & Plan:

## 2013-12-25 NOTE — Assessment & Plan Note (Signed)
Not related to eating ---nothing to suggest gallbladder or bowel problems Positional and responds to rest and lying flat Must be some muscular strain Large ventral hernia doesn't appear to be involved No rash of shingles  Probably has the throat fullness from the ibuprofen  Discussed continuing heat---seems to help Try to stick with tylenol if possible Abdominal binder may help

## 2013-12-26 ENCOUNTER — Telehealth: Payer: Self-pay | Admitting: Internal Medicine

## 2013-12-26 NOTE — Telephone Encounter (Signed)
Relevant patient education assigned to patient using Emmi. ° °

## 2014-07-13 ENCOUNTER — Other Ambulatory Visit: Payer: Self-pay

## 2014-07-30 ENCOUNTER — Other Ambulatory Visit: Payer: Self-pay | Admitting: Internal Medicine

## 2014-09-19 ENCOUNTER — Encounter: Payer: Self-pay | Admitting: Internal Medicine

## 2014-09-19 ENCOUNTER — Ambulatory Visit (INDEPENDENT_AMBULATORY_CARE_PROVIDER_SITE_OTHER): Payer: 59 | Admitting: Internal Medicine

## 2014-09-19 VITALS — BP 180/100 | HR 60 | Temp 98.4°F | Ht 71.0 in | Wt 350.0 lb

## 2014-09-19 DIAGNOSIS — R7301 Impaired fasting glucose: Secondary | ICD-10-CM

## 2014-09-19 DIAGNOSIS — G4733 Obstructive sleep apnea (adult) (pediatric): Secondary | ICD-10-CM

## 2014-09-19 DIAGNOSIS — R7303 Prediabetes: Secondary | ICD-10-CM | POA: Insufficient documentation

## 2014-09-19 DIAGNOSIS — D369 Benign neoplasm, unspecified site: Secondary | ICD-10-CM

## 2014-09-19 DIAGNOSIS — Z0000 Encounter for general adult medical examination without abnormal findings: Secondary | ICD-10-CM

## 2014-09-19 DIAGNOSIS — I10 Essential (primary) hypertension: Secondary | ICD-10-CM

## 2014-09-19 DIAGNOSIS — Z23 Encounter for immunization: Secondary | ICD-10-CM

## 2014-09-19 LAB — COMPREHENSIVE METABOLIC PANEL
ALBUMIN: 4.2 g/dL (ref 3.5–5.2)
ALT: 51 U/L (ref 0–53)
AST: 33 U/L (ref 0–37)
Alkaline Phosphatase: 51 U/L (ref 39–117)
BUN: 21 mg/dL (ref 6–23)
CALCIUM: 9.3 mg/dL (ref 8.4–10.5)
CHLORIDE: 106 meq/L (ref 96–112)
CO2: 26 meq/L (ref 19–32)
Creatinine, Ser: 1 mg/dL (ref 0.4–1.5)
GFR: 81.25 mL/min (ref >60.00)
Glucose, Bld: 100 mg/dL — ABNORMAL HIGH (ref 70–99)
Potassium: 4.2 mEq/L (ref 3.5–5.1)
SODIUM: 138 meq/L (ref 135–145)
TOTAL PROTEIN: 7 g/dL (ref 6.0–8.3)
Total Bilirubin: 0.5 mg/dL (ref 0.2–1.2)

## 2014-09-19 LAB — CBC WITH DIFFERENTIAL/PLATELET
BASOS ABS: 0 10*3/uL (ref 0.0–0.1)
Basophils Relative: 0.5 % (ref 0.0–3.0)
EOS PCT: 2.7 % (ref 0.0–5.0)
Eosinophils Absolute: 0.1 10*3/uL (ref 0.0–0.7)
HCT: 43.7 % (ref 39.0–52.0)
Hemoglobin: 14.6 g/dL (ref 13.0–17.0)
LYMPHS PCT: 31.3 % (ref 12.0–46.0)
Lymphs Abs: 1.5 10*3/uL (ref 0.7–4.0)
MCHC: 33.5 g/dL (ref 30.0–36.0)
MCV: 91.6 fl (ref 78.0–100.0)
MONOS PCT: 12.7 % — AB (ref 3.0–12.0)
Monocytes Absolute: 0.6 10*3/uL (ref 0.1–1.0)
NEUTROS PCT: 52.8 % (ref 43.0–77.0)
Neutro Abs: 2.6 10*3/uL (ref 1.4–7.7)
PLATELETS: 160 10*3/uL (ref 150.0–400.0)
RBC: 4.78 Mil/uL (ref 4.22–5.81)
RDW: 14.3 % (ref 11.5–15.5)
WBC: 4.9 10*3/uL (ref 4.0–10.5)

## 2014-09-19 LAB — HEMOGLOBIN A1C: HEMOGLOBIN A1C: 6.1 % (ref 4.6–6.5)

## 2014-09-19 MED ORDER — DILTIAZEM HCL ER 180 MG PO CP24: 180.0000 mg | ORAL_CAPSULE | Freq: Every day | ORAL | Status: DC

## 2014-09-19 NOTE — Progress Notes (Signed)
Pre visit review using our clinic review tool, if applicable. No additional management support is needed unless otherwise documented below in the visit note. 

## 2014-09-19 NOTE — Assessment & Plan Note (Signed)
Does well with the CPAP 

## 2014-09-19 NOTE — Addendum Note (Signed)
Addended by: Despina Hidden on: 09/19/2014 01:08 PM   Modules accepted: Orders, Medications

## 2014-09-19 NOTE — Assessment & Plan Note (Signed)
Will check labs

## 2014-09-19 NOTE — Assessment & Plan Note (Signed)
Discussed doing better with exercise when home

## 2014-09-19 NOTE — Progress Notes (Signed)
Subjective:    Patient ID: Marcus Williamson, male    DOB: 1951/07/04, 63 y.o.   MRN: [id number]  HPI Here for physical Still on the road most of the time--no plans for retirement yet  Had been down to 334# but gained weight back due to his routine Hard to eat healthy on the road  Not checking his blood pressure at home Has missed the second lisinopril dose at times--- discussed taking all meds at the same time  Still sleeps well with the CPAP  Still smokes As much as 1-2 per day Discussed but he is unwilling to quit  Current Outpatient Prescriptions on File Prior to Visit  Medication Sig Dispense Refill  . atenolol (TENORMIN) 100 MG tablet TAKE 1 TABLET BY MOUTH DAILY. 90 tablet 0  . diltiazem (CARDIZEM) 90 MG tablet TAKE 1 TABLET BY MOUTH 2 TIMES DAILY. 180 tablet 0  . doxazosin (CARDURA) 8 MG tablet TAKE 1 TABLET (8 MG TOTAL) BY MOUTH DAILY. 90 tablet 0  . lisinopril-hydrochlorothiazide (PRINZIDE,ZESTORETIC) 20-12.5 MG per tablet TAKE 1 TABLET BY MOUTH 2 TIMES DAILY. (Patient taking differently: TAKE 2 TABLETS DAILY) 180 tablet 0   No current facility-administered medications on file prior to visit.    No Known Allergies  No past medical history on file.  No past surgical history on file.  Family History  Problem Relation Age of Onset  . Alzheimer's disease Mother   . Heart disease Father   . Cancer Father     unknown primary    History   Social History  . Marital Status: Married    Spouse Name: N/A    Number of Children: 1  . Years of Education: N/A   Occupational History  . Long distance trucker    Social History Main Topics  . Smoking status: Current Some Day Smoker  . Smokeless tobacco: Never Used  . Alcohol Use: Yes  . Drug Use: No  . Sexual Activity: Not on file   Other Topics Concern  . Not on file   Social History Narrative   Review of Systems  Constitutional: Negative for fatigue and unexpected weight change.       Wears seat belt    HENT: Positive for hearing loss and tinnitus.        Stable hearing loss and tinnitus Regular with dentsit  Eyes: Negative for visual disturbance.       No diplopia or unilateral vision loss  Respiratory: Negative for cough, chest tightness and shortness of breath.   Cardiovascular: Positive for leg swelling. Negative for chest pain and palpitations.       Leg leg swelling--uses support socks when driving  Gastrointestinal: Negative for nausea, vomiting, abdominal pain, constipation and blood in stool.       No heartburn lately  Endocrine: Negative for polydipsia and polyuria.  Genitourinary: Positive for urgency. Negative for frequency and difficulty urinating.       No sexual problems  Musculoskeletal: Positive for arthralgias. Negative for back pain and joint swelling.       Knee pain--uses tylenol or motrin  Skin: Positive for rash.       Has dry itchy patches on back No suspicious lesions  Allergic/Immunologic: Negative for environmental allergies and immunocompromised state.       Hay fever in past--better now  Neurological: Negative for dizziness, syncope, weakness, light-headedness, numbness and headaches.  Hematological: Negative for adenopathy. Bruises/bleeds easily.  Psychiatric/Behavioral: Negative for sleep disturbance and dysphoric mood. The patient is  not nervous/anxious.        Objective:   Physical Exam  Constitutional: He is oriented to person, place, and time. He appears well-developed and well-nourished. No distress.  HENT:  Head: Normocephalic and atraumatic.  Right Ear: External ear normal.  Left Ear: External ear normal.  Mouth/Throat: Oropharynx is clear and moist. No oropharyngeal exudate.  Eyes: Conjunctivae and EOM are normal. Pupils are equal, round, and reactive to light.  Neck: Normal range of motion. Neck supple. No thyromegaly present.  Cardiovascular: Normal rate, regular rhythm, normal heart sounds and intact distal pulses.  Exam reveals no  gallop.   No murmur heard. Pulmonary/Chest: Effort normal and breath sounds normal. No respiratory distress. He has no wheezes. He has no rales.  Abdominal: Soft. Bowel sounds are normal. There is no tenderness.  Musculoskeletal: He exhibits no edema or tenderness.  Lymphadenopathy:    He has no cervical adenopathy.  Neurological: He is alert and oriented to person, place, and time.  Skin: No rash noted. No erythema.  Psychiatric: He has a normal mood and affect. His behavior is normal.          Assessment & Plan:

## 2014-09-19 NOTE — Patient Instructions (Signed)
Please have your wife check your blood pressure regularly (once a week?) If it is consistently over 140/90 at home, let me know and I can increase the diltiazem dose.

## 2014-09-19 NOTE — Assessment & Plan Note (Signed)
BP Readings from Last 3 Encounters:  09/19/14 180/100  12/25/13 140/90  09/27/13 160/90   Usually okay Will change his meds to just once a day dosing Will have wife check his BP (she is an Therapist, sports) Increase diltiazem dose prn

## 2014-09-19 NOTE — Assessment & Plan Note (Signed)
Due for Tdap today Had 1 adenomatous polyp back in 2008---didn't go for recall Will set up for repeat when he can schedule it Discussed fitness Not willing to give up his occasional cigarette

## 2014-09-20 LAB — T4, FREE: FREE T4: 0.73 ng/dL (ref 0.60–1.60)

## 2014-10-27 ENCOUNTER — Other Ambulatory Visit: Payer: Self-pay | Admitting: Internal Medicine

## 2015-04-24 ENCOUNTER — Other Ambulatory Visit: Payer: Self-pay | Admitting: Internal Medicine

## 2015-09-11 ENCOUNTER — Other Ambulatory Visit: Payer: Self-pay | Admitting: Internal Medicine

## 2015-10-16 ENCOUNTER — Other Ambulatory Visit: Payer: Self-pay | Admitting: Internal Medicine

## 2015-12-23 ENCOUNTER — Other Ambulatory Visit: Payer: Self-pay | Admitting: Internal Medicine

## 2015-12-23 ENCOUNTER — Telehealth: Payer: Self-pay

## 2015-12-23 NOTE — Telephone Encounter (Signed)
Patient last had a OV 09-19-14. I refilled his atenolol for 90 days. He needs to schedule a CPE or at least an OV for his blood pressure before 90 days. His appointment in 2015 said to followup in 1 year. Please schedule. Thanks

## 2016-01-22 NOTE — Telephone Encounter (Signed)
Left message to call office

## 2016-01-22 NOTE — Telephone Encounter (Signed)
Spoke to pt. Made appt for June 5th for OV

## 2016-03-02 ENCOUNTER — Ambulatory Visit: Payer: Self-pay | Admitting: Internal Medicine

## 2016-03-16 ENCOUNTER — Ambulatory Visit (INDEPENDENT_AMBULATORY_CARE_PROVIDER_SITE_OTHER): Payer: 59 | Admitting: Internal Medicine

## 2016-03-16 ENCOUNTER — Encounter: Payer: Self-pay | Admitting: Internal Medicine

## 2016-03-16 VITALS — BP 136/98 | HR 83 | Temp 98.2°F | Wt 364.0 lb

## 2016-03-16 DIAGNOSIS — Z125 Encounter for screening for malignant neoplasm of prostate: Secondary | ICD-10-CM | POA: Diagnosis not present

## 2016-03-16 DIAGNOSIS — R7301 Impaired fasting glucose: Secondary | ICD-10-CM | POA: Diagnosis not present

## 2016-03-16 DIAGNOSIS — G4733 Obstructive sleep apnea (adult) (pediatric): Secondary | ICD-10-CM | POA: Diagnosis not present

## 2016-03-16 DIAGNOSIS — I10 Essential (primary) hypertension: Secondary | ICD-10-CM

## 2016-03-16 DIAGNOSIS — D126 Benign neoplasm of colon, unspecified: Secondary | ICD-10-CM | POA: Insufficient documentation

## 2016-03-16 DIAGNOSIS — Z0000 Encounter for general adult medical examination without abnormal findings: Secondary | ICD-10-CM

## 2016-03-16 DIAGNOSIS — D696 Thrombocytopenia, unspecified: Secondary | ICD-10-CM

## 2016-03-16 LAB — HEPATIC FUNCTION PANEL
ALBUMIN: 4.2 g/dL (ref 3.5–5.2)
ALT: 50 U/L (ref 0–53)
AST: 26 U/L (ref 0–37)
Alkaline Phosphatase: 53 U/L (ref 39–117)
BILIRUBIN DIRECT: 0.1 mg/dL (ref 0.0–0.3)
TOTAL PROTEIN: 6.7 g/dL (ref 6.0–8.3)
Total Bilirubin: 0.4 mg/dL (ref 0.2–1.2)

## 2016-03-16 LAB — CBC WITH DIFFERENTIAL/PLATELET
BASOS PCT: 0.6 % (ref 0.0–3.0)
Basophils Absolute: 0 10*3/uL (ref 0.0–0.1)
EOS ABS: 0.1 10*3/uL (ref 0.0–0.7)
EOS PCT: 2.1 % (ref 0.0–5.0)
HEMATOCRIT: 40.2 % (ref 39.0–52.0)
HEMOGLOBIN: 13.9 g/dL (ref 13.0–17.0)
LYMPHS PCT: 29.6 % (ref 12.0–46.0)
Lymphs Abs: 1.5 10*3/uL (ref 0.7–4.0)
MCHC: 34.6 g/dL (ref 30.0–36.0)
MCV: 92.2 fl (ref 78.0–100.0)
MONOS PCT: 13.1 % — AB (ref 3.0–12.0)
Monocytes Absolute: 0.7 10*3/uL (ref 0.1–1.0)
NEUTROS ABS: 2.8 10*3/uL (ref 1.4–7.7)
Neutrophils Relative %: 54.6 % (ref 43.0–77.0)
PLATELETS: 136 10*3/uL — AB (ref 150.0–400.0)
RBC: 4.36 Mil/uL (ref 4.22–5.81)
RDW: 13.8 % (ref 11.5–15.5)
WBC: 5.1 10*3/uL (ref 4.0–10.5)

## 2016-03-16 LAB — COMPREHENSIVE METABOLIC PANEL
ALBUMIN: 4.2 g/dL (ref 3.5–5.2)
ALT: 50 U/L (ref 0–53)
AST: 26 U/L (ref 0–37)
Alkaline Phosphatase: 53 U/L (ref 39–117)
BUN: 18 mg/dL (ref 6–23)
CALCIUM: 9.3 mg/dL (ref 8.4–10.5)
CHLORIDE: 105 meq/L (ref 96–112)
CO2: 26 meq/L (ref 19–32)
Creatinine, Ser: 0.91 mg/dL (ref 0.40–1.50)
GFR: 89.12 mL/min (ref >60.00)
Glucose, Bld: 94 mg/dL (ref 70–99)
POTASSIUM: 4.5 meq/L (ref 3.5–5.1)
Sodium: 139 mEq/L (ref 135–145)
Total Bilirubin: 0.4 mg/dL (ref 0.2–1.2)
Total Protein: 6.7 g/dL (ref 6.0–8.3)

## 2016-03-16 LAB — LIPID PANEL
CHOL/HDL RATIO: 4
Cholesterol: 187 mg/dL (ref 0–200)
HDL: 44 mg/dL (ref >39.00)
LDL Cholesterol: 120 mg/dL — ABNORMAL HIGH (ref 0–99)
NONHDL: 143.03
TRIGLYCERIDES: 117 mg/dL (ref 0.0–149.0)
VLDL: 23.4 mg/dL (ref 0.0–40.0)

## 2016-03-16 LAB — HEMOGLOBIN A1C: Hgb A1c MFr Bld: 5.8 % (ref 4.6–6.5)

## 2016-03-16 LAB — PSA: PSA: 0.37 ng/mL (ref 0.10–4.00)

## 2016-03-16 MED ORDER — ATENOLOL 100 MG PO TABS: 100.0000 mg | ORAL_TABLET | Freq: Every day | ORAL | Status: DC

## 2016-03-16 MED ORDER — DOXAZOSIN MESYLATE 8 MG PO TABS: 8.0000 mg | ORAL_TABLET | Freq: Every day | ORAL | Status: DC

## 2016-03-16 MED ORDER — DILTIAZEM HCL ER 90 MG PO CP12: 90.0000 mg | ORAL_CAPSULE | Freq: Two times a day (BID) | ORAL | Status: DC

## 2016-03-16 NOTE — Patient Instructions (Signed)
DASH Eating Plan  DASH stands for "Dietary Approaches to Stop Hypertension." The DASH eating plan is a healthy eating plan that has been shown to reduce high blood pressure (hypertension). Additional health benefits may include reducing the risk of type 2 diabetes mellitus, heart disease, and stroke. The DASH eating plan may also help with weight loss.  WHAT DO I NEED TO KNOW ABOUT THE DASH EATING PLAN?  For the DASH eating plan, you will follow these general guidelines:  · Choose foods with a percent daily value for sodium of less than 5% (as listed on the food label).  · Use salt-free seasonings or herbs instead of table salt or sea salt.  · Check with your health care provider or pharmacist before using salt substitutes.  · Eat lower-sodium products, often labeled as "lower sodium" or "no salt added."  · Eat fresh foods.  · Eat more vegetables, fruits, and low-fat dairy products.  · Choose whole grains. Look for the word "whole" as the first word in the ingredient list.  · Choose fish and skinless chicken or turkey more often than red meat. Limit fish, poultry, and meat to 6 oz (170 g) each day.  · Limit sweets, desserts, sugars, and sugary drinks.  · Choose heart-healthy fats.  · Limit cheese to 1 oz (28 g) per day.  · Eat more home-cooked food and less restaurant, buffet, and fast food.  · Limit fried foods.  · Cook foods using methods other than frying.  · Limit canned vegetables. If you do use them, rinse them well to decrease the sodium.  · When eating at a restaurant, ask that your food be prepared with less salt, or no salt if possible.  WHAT FOODS CAN I EAT?  Seek help from a dietitian for individual calorie needs.  Grains  Whole grain or whole wheat bread. Brown rice. Whole grain or whole wheat pasta. Quinoa, bulgur, and whole grain cereals. Low-sodium cereals. Corn or whole wheat flour tortillas. Whole grain cornbread. Whole grain crackers. Low-sodium crackers.  Vegetables  Fresh or frozen vegetables  (raw, steamed, roasted, or grilled). Low-sodium or reduced-sodium tomato and vegetable juices. Low-sodium or reduced-sodium tomato sauce and paste. Low-sodium or reduced-sodium canned vegetables.   Fruits  All fresh, canned (in natural juice), or frozen fruits.  Meat and Other Protein Products  Ground beef (85% or leaner), grass-fed beef, or beef trimmed of fat. Skinless chicken or turkey. Ground chicken or turkey. Pork trimmed of fat. All fish and seafood. Eggs. Dried beans, peas, or lentils. Unsalted nuts and seeds. Unsalted canned beans.  Dairy  Low-fat dairy products, such as skim or 1% milk, 2% or reduced-fat cheeses, low-fat ricotta or cottage cheese, or plain low-fat yogurt. Low-sodium or reduced-sodium cheeses.  Fats and Oils  Tub margarines without trans fats. Light or reduced-fat mayonnaise and salad dressings (reduced sodium). Avocado. Safflower, olive, or canola oils. Natural peanut or almond butter.  Other  Unsalted popcorn and pretzels.  The items listed above may not be a complete list of recommended foods or beverages. Contact your dietitian for more options.  WHAT FOODS ARE NOT RECOMMENDED?  Grains  White bread. White pasta. White rice. Refined cornbread. Bagels and croissants. Crackers that contain trans fat.  Vegetables  Creamed or fried vegetables. Vegetables in a cheese sauce. Regular canned vegetables. Regular canned tomato sauce and paste. Regular tomato and vegetable juices.  Fruits  Dried fruits. Canned fruit in light or heavy syrup. Fruit juice.  Meat and Other Protein   Products  Fatty cuts of meat. Ribs, chicken wings, bacon, sausage, bologna, salami, chitterlings, fatback, hot dogs, bratwurst, and packaged luncheon meats. Salted nuts and seeds. Canned beans with salt.  Dairy  Whole or 2% milk, cream, half-and-half, and cream cheese. Whole-fat or sweetened yogurt. Full-fat cheeses or blue cheese. Nondairy creamers and whipped toppings. Processed cheese, cheese spreads, or cheese  curds.  Condiments  Onion and garlic salt, seasoned salt, table salt, and sea salt. Canned and packaged gravies. Worcestershire sauce. Tartar sauce. Barbecue sauce. Teriyaki sauce. Soy sauce, including reduced sodium. Steak sauce. Fish sauce. Oyster sauce. Cocktail sauce. Horseradish. Ketchup and mustard. Meat flavorings and tenderizers. Bouillon cubes. Hot sauce. Tabasco sauce. Marinades. Taco seasonings. Relishes.  Fats and Oils  Butter, stick margarine, lard, shortening, ghee, and bacon fat. Coconut, palm kernel, or palm oils. Regular salad dressings.  Other  Pickles and olives. Salted popcorn and pretzels.  The items listed above may not be a complete list of foods and beverages to avoid. Contact your dietitian for more information.  WHERE CAN I FIND MORE INFORMATION?  National Heart, Lung, and Blood Institute: www.nhlbi.nih.gov/health/health-topics/topics/dash/     This information is not intended to replace advice given to you by your health care provider. Make sure you discuss any questions you have with your health care provider.     Document Released: 09/03/2011 Document Revised: 10/05/2014 Document Reviewed: 07/19/2013  Elsevier Interactive Patient Education ©2016 [employer]

## 2016-03-16 NOTE — Assessment & Plan Note (Signed)
Will recheck labs 

## 2016-03-16 NOTE — Assessment & Plan Note (Signed)
BP Readings from Last 3 Encounters:  03/16/16 136/98  09/19/14 180/100  12/25/13 140/90   Fairly good today--and missed doses 2 days ago Continue same Rx

## 2016-03-16 NOTE — Progress Notes (Signed)
   Subjective:    Patient ID: Marcus Williamson, male    DOB: 1951-04-05, 65 y.o.   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 atenolol (TENORMIN) 100 MG tablet TAKE 1 TABLET BY MOUTH EVERY DAY 90 tablet 0  . diltiazem (CARDIZEM CD) 180 MG 24 hr capsule TAKE ONE CAPSULE BY MOUTH ONCE DAILY 90 capsule 0  . doxazosin (CARDURA) 8 MG tablet TAKE 1 TABLET BY MOUTH EVERY DAY 90 tablet 0  . lisinopril-hydrochlorothiazide (PRINZIDE,ZESTORETIC) 20-12.5 MG tablet TAKE 1 TABLET BY MOUTH TWICE A DAY 180 tablet 0   No current facility-administered medications on file prior to visit.    No Known Allergies  Past Medical History  Diagnosis Date  . Adenomatous polyp of colon   . HTN (hypertension)   . Obstructive sleep apnea     Past Surgical History  Procedure Laterality Date  . Knee arthroscopy    . Tympanostomy tube placement    . Mastoidectomy      bilateral--but mostly on left    Family History  Problem Relation Age of Onset  . Alzheimer's disease Mother   . Heart disease Father   . Cancer Father     unknown primary    Social History   Social History  . Marital Status: Married    Spouse Name: N/A  . Number of Children: 1  . Years of Education: N/A   Occupational History  . Long distance trucker    Social History Main Topics  . Smoking status: Current Some Day Smoker  . Smokeless tobacco: Never Used     Comment: occ  cigar or cigarette--not daily  . Alcohol Use: 0.0 oz/week    0 Standard drinks or equivalent per week  . Drug Use: No  . Sexual Activity: Not on file   Other Topics Concern  . Not on file   Social History Narrative   Review of Systems Tinnitus worsening--chronic ear issues Sleeps well with the CPAP Weight is up around 10#      Objective:   Physical Exam  Constitutional: He appears well-developed. No distress.  Neck: Normal range of motion. Neck supple. No thyromegaly present.  Cardiovascular: Normal rate, regular rhythm, normal heart sounds and intact distal pulses.  Exam reveals no gallop.   No murmur heard. Pulmonary/Chest: Effort normal and breath sounds normal. No respiratory distress. He has no wheezes. He has no rales.  Abdominal: Soft. There is no tenderness.  Musculoskeletal:  Thick calves and feet without pitting  Lymphadenopathy:    He has no cervical adenopathy.  Skin: No rash noted.  Psychiatric: He has a normal mood and affect. His behavior is normal.        Assessment & Plan:

## 2016-03-16 NOTE — Progress Notes (Signed)
Pre visit review using our clinic review tool, if applicable. No additional management support is needed unless otherwise documented below in the visit note. 

## 2016-03-16 NOTE — Assessment & Plan Note (Signed)
Should have had follow up in 2013 Will send referral again

## 2016-03-16 NOTE — Assessment & Plan Note (Signed)
Always wears CPAP  Especially careful with CDL

## 2016-03-16 NOTE — Addendum Note (Signed)
Addended by: Ellamae Sia on: 03/16/2016 01:10 PM   Modules accepted: Miquel Dunn

## 2016-03-16 NOTE — Assessment & Plan Note (Signed)
Overdue for colon Will check PSA after discussion Discussed fitness, etc

## 2016-03-17 DIAGNOSIS — D696 Thrombocytopenia, unspecified: Secondary | ICD-10-CM | POA: Insufficient documentation

## 2016-03-17 NOTE — Assessment & Plan Note (Signed)
Again noted on labs Borderline No action needed

## 2016-03-17 NOTE — Addendum Note (Signed)
Addended by: Viviana Simpler I on: 03/17/2016 08:11 AM   Modules accepted: Miquel Dunn

## 2016-03-19 ENCOUNTER — Encounter: Payer: Self-pay | Admitting: Gastroenterology

## 2016-05-05 ENCOUNTER — Other Ambulatory Visit: Payer: Self-pay | Admitting: Internal Medicine

## 2016-05-06 ENCOUNTER — Telehealth: Payer: Self-pay

## 2016-05-06 NOTE — Telephone Encounter (Signed)
Office visit please. Thanks

## 2016-05-06 NOTE — Telephone Encounter (Signed)
?  OV or direct hospital case?

## 2016-05-07 ENCOUNTER — Encounter: Payer: Self-pay | Admitting: Gastroenterology

## 2016-05-07 NOTE — Telephone Encounter (Signed)
OV Appointment was made w/wife 07/20/16 at 830 am with Dr.Armbruster.

## 2016-05-25 ENCOUNTER — Encounter: Payer: 59 | Admitting: Gastroenterology

## 2016-07-20 ENCOUNTER — Ambulatory Visit: Payer: 59 | Admitting: Gastroenterology

## 2017-02-18 ENCOUNTER — Other Ambulatory Visit: Payer: Self-pay | Admitting: Internal Medicine

## 2017-02-24 ENCOUNTER — Other Ambulatory Visit: Payer: Self-pay | Admitting: Internal Medicine

## 2017-03-04 ENCOUNTER — Encounter: Payer: Self-pay | Admitting: Internal Medicine

## 2017-03-29 ENCOUNTER — Encounter: Payer: 59 | Admitting: Internal Medicine

## 2017-03-30 ENCOUNTER — Encounter: Payer: 59 | Admitting: Internal Medicine

## 2017-04-21 ENCOUNTER — Other Ambulatory Visit: Payer: Self-pay | Admitting: Internal Medicine

## 2017-05-16 ENCOUNTER — Other Ambulatory Visit: Payer: Self-pay | Admitting: Internal Medicine

## 2017-05-24 ENCOUNTER — Other Ambulatory Visit: Payer: Self-pay | Admitting: Internal Medicine

## 2017-06-11 ENCOUNTER — Other Ambulatory Visit: Payer: Self-pay | Admitting: Internal Medicine

## 2017-07-18 ENCOUNTER — Other Ambulatory Visit: Payer: Self-pay | Admitting: Internal Medicine

## 2017-07-19 ENCOUNTER — Telehealth: Payer: Self-pay | Admitting: *Deleted

## 2017-07-19 NOTE — Telephone Encounter (Signed)
Called patient and left a message regarding his refill on him med and that he needs to make an appointment with his provider to cont to get his refills.

## 2017-08-14 ENCOUNTER — Other Ambulatory Visit: Payer: Self-pay | Admitting: Internal Medicine

## 2017-08-23 ENCOUNTER — Other Ambulatory Visit: Payer: Self-pay | Admitting: Internal Medicine

## 2017-08-30 ENCOUNTER — Ambulatory Visit (INDEPENDENT_AMBULATORY_CARE_PROVIDER_SITE_OTHER): Payer: 59 | Admitting: Internal Medicine

## 2017-08-30 ENCOUNTER — Encounter: Payer: Self-pay | Admitting: Internal Medicine

## 2017-08-30 VITALS — BP 122/88 | HR 53 | Temp 98.2°F | Ht 69.75 in | Wt 349.0 lb

## 2017-08-30 DIAGNOSIS — D126 Benign neoplasm of colon, unspecified: Secondary | ICD-10-CM | POA: Diagnosis not present

## 2017-08-30 DIAGNOSIS — Z0000 Encounter for general adult medical examination without abnormal findings: Secondary | ICD-10-CM

## 2017-08-30 DIAGNOSIS — Z0001 Encounter for general adult medical examination with abnormal findings: Secondary | ICD-10-CM

## 2017-08-30 DIAGNOSIS — I10 Essential (primary) hypertension: Secondary | ICD-10-CM | POA: Diagnosis not present

## 2017-08-30 DIAGNOSIS — M25462 Effusion, left knee: Secondary | ICD-10-CM | POA: Diagnosis not present

## 2017-08-30 DIAGNOSIS — Z23 Encounter for immunization: Secondary | ICD-10-CM

## 2017-08-30 LAB — CBC
HCT: 42.8 % (ref 39.0–52.0)
HEMOGLOBIN: 14.3 g/dL (ref 13.0–17.0)
MCHC: 33.5 g/dL (ref 30.0–36.0)
MCV: 95 fl (ref 78.0–100.0)
Platelets: 159 10*3/uL (ref 150.0–400.0)
RBC: 4.51 Mil/uL (ref 4.22–5.81)
RDW: 13.4 % (ref 11.5–15.5)
WBC: 6.1 10*3/uL (ref 4.0–10.5)

## 2017-08-30 NOTE — Assessment & Plan Note (Signed)
Healthy but needs to work on fitness--hard with his schedule Did lose some weight Defer PSA Will try the colon again

## 2017-08-30 NOTE — Addendum Note (Signed)
Addended by: Pilar Grammes on: 08/30/2017 02:26 PM   Modules accepted: Orders

## 2017-08-30 NOTE — Progress Notes (Addendum)
Subjective:    Patient ID: Marcus Williamson, male    DOB: July 24, 1951, 66 y.o.   MRN: [id number]  HPI Here for physical  Doing okay [place] 11/1 down 1 step at son's wedding Still having pain in left knee Still some swelling---some trouble getting in and out of truck Slightly unstable  Had colonoscopy--but missed it They required a meet and greet with new physician  Occasionally checks BP Has been okay  Current Outpatient Medications on File Prior to Visit  Medication Sig Dispense Refill  . atenolol (TENORMIN) 50 MG tablet TAKE 2 TABLETS (100 MG TOTAL) BY MOUTH DAILY. MUST SCHEDULE OFFICE VISIT 180 tablet 0  . diltiazem (CARDIZEM SR) 90 MG 12 hr capsule TAKE 1 CAPSULE (90 MG TOTAL) BY MOUTH 2 (TWO) TIMES DAILY. 180 capsule 0  . doxazosin (CARDURA) 8 MG tablet TAKE 1 TABLET (8 MG TOTAL) BY MOUTH DAILY. PLEASE SCHEDULE PHYSICAL EXAM 90 tablet 0  . lisinopril-hydrochlorothiazide (PRINZIDE,ZESTORETIC) 20-12.5 MG tablet TAKE 1 TABLET BY MOUTH TWICE A DAY 180 tablet 0   No current facility-administered medications on file prior to visit.     No Known Allergies  Past Medical History:  Diagnosis Date  . Adenomatous polyp of colon   . HTN (hypertension)   . Obstructive sleep apnea     Past Surgical History:  Procedure Laterality Date  . KNEE ARTHROSCOPY    . MASTOIDECTOMY     bilateral--but mostly on left  . TYMPANOSTOMY TUBE PLACEMENT      Family History  Problem Relation Age of Onset  . Alzheimer's disease Mother   . Heart disease Father   . Cancer Father        unknown primary    Social History   Socioeconomic History  . Marital status: Married    Spouse name: Not on file  . Number of children: 1  . Years of education: Not on file  . Highest education level: Not on file  Social Needs  . Financial resource strain: Not on file  . Food insecurity - worry: Not on file  . Food insecurity - inability: Not on file  . Transportation needs - medical: Not on file  .  Transportation needs - non-medical: Not on file  Occupational History  . Occupation: [employer] secretary  Tobacco Use  . Smoking status: Current Some Day Smoker  . Smokeless tobacco: Never Used  . Tobacco comment: occ cigar or cigarette--not daily  Substance and Sexual Activity  . Alcohol use: Yes    Alcohol/week: 0.0 oz  . Drug use: No  . Sexual activity: Not on file  Other Topics Concern  . Not on file  Social History Narrative  . Not on file   Review of Systems  Constitutional:       Weight down slightly Wears seat belt  HENT: Positive for hearing loss and tinnitus. Negative for trouble swallowing.        May needs some extractions--2 broken teeth  Eyes:       No unilateral vision loss Occasional double vision--if truck rocking a lot, clears after a little while (with some dizziness)  Respiratory: Negative for cough, chest tightness and shortness of breath.   Cardiovascular: Positive for leg swelling. Negative for chest pain and palpitations.  Gastrointestinal: Negative for blood in stool and constipation.       No heartburn  Endocrine: Negative for polydipsia and polyuria.  Genitourinary: Negative for difficulty urinating and urgency.       No sexual  problems  Musculoskeletal: Positive for arthralgias. Negative for back pain and joint swelling.       Some hand stiffness  Skin: Negative for rash.       No suspicious lesions  Allergic/Immunologic: Positive for environmental allergies. Negative for immunocompromised state.  Neurological: Positive for dizziness. Negative for syncope and light-headedness.       Rare headaches  Hematological: Negative for adenopathy. Does not bruise/bleed easily.  Psychiatric/Behavioral: Negative for sleep disturbance.       Sleeps okay as long as he uses CPAP. Has 2 in truck, 1 for travel, one for home. Uses it every night, all night.       Objective:   Physical Exam  Constitutional: He is oriented to person, place, and time. He  appears well-developed. No distress.  HENT:  Head: Normocephalic and atraumatic.  Right Ear: External ear normal.  Left Ear: External ear normal.  Mouth/Throat: Oropharynx is clear and moist. No oropharyngeal exudate.  Eyes: Conjunctivae are normal. Pupils are equal, round, and reactive to light.  Neck: No thyromegaly present.  Cardiovascular: Normal rate, regular rhythm and intact distal pulses. Exam reveals no gallop.  Gr 2/6 aortic systolic murmur  Pulmonary/Chest: Effort normal and breath sounds normal. No respiratory distress. He has no wheezes. He has no rales.  Abdominal: Soft. He exhibits no distension. There is no tenderness. There is no rebound and no guarding.  Musculoskeletal:  Thick calves without pitting Effusion in left knee---not clearly unstable  Lymphadenopathy:    He has no cervical adenopathy.  Neurological: He is alert and oriented to person, place, and time.  Skin: No rash noted. No erythema.  Psychiatric: He has a normal mood and affect. His behavior is normal.          Assessment & Plan:

## 2017-08-30 NOTE — Assessment & Plan Note (Signed)
BP Readings from Last 3 Encounters:  08/30/17 122/88  03/16/16 (!) 136/98  09/19/14 (!) 180/100   Control is good now Will check routine labs

## 2017-08-30 NOTE — Assessment & Plan Note (Signed)
If not satisfied with slow improvement--will try cortisone shot/drainage

## 2017-08-31 LAB — COMPREHENSIVE METABOLIC PANEL
ALK PHOS: 41 U/L (ref 39–117)
ALT: 52 U/L (ref 0–53)
AST: 30 U/L (ref 0–37)
Albumin: 4.6 g/dL (ref 3.5–5.2)
BUN: 19 mg/dL (ref 6–23)
CO2: 26 mEq/L (ref 19–32)
Calcium: 9.9 mg/dL (ref 8.4–10.5)
Chloride: 104 mEq/L (ref 96–112)
Creatinine, Ser: 0.99 mg/dL (ref 0.40–1.50)
GFR: 80.49 mL/min (ref >60.00)
GLUCOSE: 103 mg/dL — AB (ref 70–99)
POTASSIUM: 4.2 meq/L (ref 3.5–5.1)
SODIUM: 138 meq/L (ref 135–145)
TOTAL PROTEIN: 7.3 g/dL (ref 6.0–8.3)
Total Bilirubin: 0.7 mg/dL (ref 0.2–1.2)

## 2017-09-09 ENCOUNTER — Other Ambulatory Visit: Payer: Self-pay | Admitting: Internal Medicine

## 2017-09-12 ENCOUNTER — Other Ambulatory Visit: Payer: Self-pay | Admitting: Internal Medicine

## 2017-09-13 ENCOUNTER — Encounter: Payer: Self-pay | Admitting: Nurse Practitioner

## 2017-09-13 ENCOUNTER — Ambulatory Visit: Payer: 59 | Admitting: Nurse Practitioner

## 2017-09-13 VITALS — BP 142/100 | HR 66 | Ht 69.0 in | Wt 356.4 lb

## 2017-09-13 DIAGNOSIS — Z1211 Encounter for screening for malignant neoplasm of colon: Secondary | ICD-10-CM | POA: Diagnosis not present

## 2017-09-13 DIAGNOSIS — I10 Essential (primary) hypertension: Secondary | ICD-10-CM | POA: Diagnosis not present

## 2017-09-13 DIAGNOSIS — E669 Obesity, unspecified: Secondary | ICD-10-CM

## 2017-09-13 NOTE — Progress Notes (Signed)
    Chief Complaint:  Colon cancer screening  HPI: Patient is a 66 year old male known remotely to Dr. Olevia Perches. He had multiple hyperplastic polyps removed 10 years ago and is due for 10 year colon cancer screening. Colonoscopy was scheduled in August with Dr. Havery Moros but we cancelled it and asked him to come in an appointment. Patient has no gastrointestinal complaints. Specifically, no bowel changes, abdominal pain, no rectal bleeding. Patient has history of hypertension, morbid obesity, obtructive sleep apnea on CPAP. He has no chest pain, no shortness of breath. 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 Adenomatous polyp of colon   . HTN (hypertension)   . Obstructive sleep apnea     Past Surgical History:  Procedure Laterality Date  . KNEE ARTHROSCOPY    . MASTOIDECTOMY     bilateral--but mostly on left  . TYMPANOSTOMY TUBE PLACEMENT     Family History  Problem Relation Age of Onset  . Alzheimer's disease Mother   . Heart disease Father   . Cancer Father        unknown primary   Social History   Tobacco Use  . Smoking status: Current Some Day Smoker    Types: Cigars  . Smokeless tobacco: Never Used  . Tobacco comment: occ cigar or cigarette--not daily  Substance Use Topics  . Alcohol use: Yes    Alcohol/week: 0.0 oz  . Drug use: No   Current Outpatient Medications  Medication Sig Dispense Refill  . atenolol (TENORMIN) 50 MG tablet TAKE 2 TABLETS (100 MG TOTAL) BY MOUTH DAILY. MUST SCHEDULE OFFICE VISIT 180 tablet 0  . doxazosin (CARDURA) 8 MG tablet TAKE 1 TABLET (8 MG TOTAL) BY MOUTH DAILY. PLEASE SCHEDULE PHYSICAL EXAM 90 tablet 0  . lisinopril-hydrochlorothiazide (PRINZIDE,ZESTORETIC) 20-12.5 MG tablet TAKE 1 TABLET BY MOUTH TWICE A DAY 180 tablet 0  . diltiazem (CARDIZEM SR) 90 MG 12 hr capsule TAKE 1 CAPSULE (90 MG TOTAL) BY MOUTH 2 (TWO) TIMES DAILY. 180 capsule 3   No  current facility-administered medications for this visit.    No Known Allergies   Review of Systems: All systems reviewed and negative except where noted in HPI.    Physical Exam: BP (!) 144/100   Pulse 66   Ht 5\' 9"  (1.753 m)   Wt (!) 356 lb 6.4 oz (161.7 kg)   BMI 52.63 kg/m  Constitutional:  Obese white male in no acute distress. Psychiatric: Normal mood and affect. Behavior is normal. Neck supple.  Cardiovascular: Normal rate, regular rhythm, + Murmur. No edema Pulmonary/chest: Effort normal and breath sounds normal. No wheezing, rales or rhonchi. Neurological: Alert and oriented to person place and time. Skin: Skin is warm and dry. No rashes noted.   ASSESSMENT AND PLAN:  59. 66 year old male for colon cancer screening. No GI complaints. Specifically, no bowel changes, abdominal pain, rectal bleeding or abnormal weight loss.  -Patient will be scheduled for a screening colonoscopy with possible polypectomy.  The risks and benefits of the procedure were discussed and the patient agrees to proceed. Procedure will need to be done in the hospital given BMI  2. HTN, BP elevated today at 144/100.   3. Morbid obesity. BMI [place], NP  09/13/2017, 8:52 AM  Cc:  Venia Carbon, MD

## 2017-09-13 NOTE — Patient Instructions (Signed)
If you are age 65 or older, your body mass index should be between 23-30. Your Body mass index is 52.63 kg/m. If this is out of the aforementioned range listed, please consider follow up with your Primary Care Provider.  If you are age 9 or younger, your body mass index should be between 19-25. Your Body mass index is 52.63 kg/m. If this is out of the aformentioned range listed, please consider follow up with your Primary Care Provider.   You have been scheduled for a colonoscopy. Please follow written instructions given to you at your visit today.  Please pick up your prep supplies at the pharmacy within the next 1-3 days. If you use inhalers (even only as needed), please bring them with you on the day of your procedure. Your physician has requested that you go to www.startemmi.com and enter the access code given to you at your visit today. This web site gives a general overview about your procedure. However, you should still follow specific instructions given to you by our office regarding your preparation for the procedure.  You have been given a Plenvue prep.  Thank you for choosing me and [facility].   Tye Savoy, NP

## 2017-09-15 NOTE — Progress Notes (Signed)
Agree with assessment and plan as outlined.  

## 2017-09-23 ENCOUNTER — Telehealth: Payer: Self-pay | Admitting: Internal Medicine

## 2017-09-23 NOTE — Telephone Encounter (Signed)
Pt called to say was able to find the Diltiazem 90 mg 12 hr capsule and wants to stay on diltiazem 90 mg rather than the 180 mg. Pt said nothing further needed.

## 2017-09-23 NOTE — Telephone Encounter (Signed)
Rx reviewed- and it is correct in chart.

## 2017-09-23 NOTE — Telephone Encounter (Signed)
Copied from [place] [id number]. Topic: General - Other >> Sep 23, 2017 12:22 PM Neva Seat wrote: Pt needs to have the dosage of Diltiazem 90mg  to stay at 90mg .  [facility] because the 90 mg were not in [facility]. [facility] to get [facility] 180 mg since pt needed the Rx refilled.  Pt wants to make sure all future Diltiazem stays at 90 mg.  [facility] [place]

## 2017-10-27 ENCOUNTER — Other Ambulatory Visit: Payer: Self-pay

## 2017-10-27 ENCOUNTER — Encounter (HOSPITAL_COMMUNITY): Payer: Self-pay | Admitting: Emergency Medicine

## 2017-11-14 NOTE — Anesthesia Preprocedure Evaluation (Addendum)
Anesthesia Evaluation  Patient identified by MRN, date of birth, ID band Patient awake    Reviewed: Allergy & Precautions, NPO status , Patient's Chart, lab work & pertinent test results  History of Anesthesia Complications Negative for: history of anesthetic complications  Airway Mallampati: III  TM Distance: >3 FB Neck ROM: Full    Dental no notable dental hx. (+) Dental Advisory Given   Pulmonary sleep apnea , Current Smoker,    Pulmonary exam normal        Cardiovascular hypertension, Pt. on home beta blockers and Pt. on medications Normal cardiovascular exam     Neuro/Psych negative neurological ROS     GI/Hepatic negative GI ROS, Neg liver ROS,   Endo/Other  Morbid obesity  Renal/GU negative Renal ROS     Musculoskeletal negative musculoskeletal ROS (+)   Abdominal   Peds  Hematology negative hematology ROS (+)   Anesthesia Other Findings Day of surgery medications reviewed with the patient.  Reproductive/Obstetrics                            Anesthesia Physical Anesthesia Plan  ASA: III  Anesthesia Plan: MAC   Post-op Pain Management:    Induction:   PONV Risk Score and Plan: 2 and Ondansetron and Propofol infusion  Airway Management Planned: Natural Airway  Additional Equipment:   Intra-op Plan:   Post-operative Plan:   Informed Consent: I have reviewed the patients History and Physical, chart, labs and discussed the procedure including the risks, benefits and alternatives for the proposed anesthesia with the patient or authorized representative who has indicated his/her understanding and acceptance.   Dental advisory given  Plan Discussed with: CRNA and Anesthesiologist  Anesthesia Plan Comments:        Anesthesia Quick Evaluation

## 2017-11-15 ENCOUNTER — Encounter (HOSPITAL_COMMUNITY)
Admission: RE | Disposition: A | Discharge status: Home / Self Care | Payer: Self-pay | Source: Ambulatory Visit | Attending: Gastroenterology

## 2017-11-15 ENCOUNTER — Other Ambulatory Visit: Payer: Self-pay | Admitting: Internal Medicine

## 2017-11-15 ENCOUNTER — Ambulatory Visit (HOSPITAL_COMMUNITY): Payer: 59 | Admitting: Anesthesiology

## 2017-11-15 ENCOUNTER — Ambulatory Visit (HOSPITAL_COMMUNITY)
Admission: RE | Admit: 2017-11-15 | Discharge: 2017-11-15 | Disposition: A | Discharge status: Home / Self Care | Payer: 59 | Source: Ambulatory Visit | Attending: Gastroenterology | Admitting: Gastroenterology

## 2017-11-15 ENCOUNTER — Other Ambulatory Visit: Payer: Self-pay

## 2017-11-15 ENCOUNTER — Encounter (HOSPITAL_COMMUNITY): Payer: Self-pay

## 2017-11-15 DIAGNOSIS — D123 Benign neoplasm of transverse colon: Secondary | ICD-10-CM | POA: Diagnosis not present

## 2017-11-15 DIAGNOSIS — Z6841 Body Mass Index (BMI) 40.0 and over, adult: Secondary | ICD-10-CM | POA: Insufficient documentation

## 2017-11-15 DIAGNOSIS — Z1211 Encounter for screening for malignant neoplasm of colon: Secondary | ICD-10-CM | POA: Diagnosis not present

## 2017-11-15 DIAGNOSIS — D122 Benign neoplasm of ascending colon: Secondary | ICD-10-CM

## 2017-11-15 DIAGNOSIS — I10 Essential (primary) hypertension: Secondary | ICD-10-CM | POA: Diagnosis not present

## 2017-11-15 DIAGNOSIS — F17290 Nicotine dependence, other tobacco product, uncomplicated: Secondary | ICD-10-CM | POA: Diagnosis not present

## 2017-11-15 DIAGNOSIS — K648 Other hemorrhoids: Secondary | ICD-10-CM | POA: Diagnosis not present

## 2017-11-15 DIAGNOSIS — K644 Residual hemorrhoidal skin tags: Secondary | ICD-10-CM | POA: Insufficient documentation

## 2017-11-15 DIAGNOSIS — D124 Benign neoplasm of descending colon: Secondary | ICD-10-CM

## 2017-11-15 DIAGNOSIS — G4733 Obstructive sleep apnea (adult) (pediatric): Secondary | ICD-10-CM | POA: Diagnosis not present

## 2017-11-15 DIAGNOSIS — Z79899 Other long term (current) drug therapy: Secondary | ICD-10-CM | POA: Insufficient documentation

## 2017-11-15 HISTORY — PX: COLONOSCOPY WITH PROPOFOL: SHX5780

## 2017-11-15 SURGERY — COLONOSCOPY WITH PROPOFOL
Anesthesia: Monitor Anesthesia Care

## 2017-11-15 MED ORDER — PROPOFOL 10 MG/ML IV BOLUS
INTRAVENOUS | Status: DC | PRN
  Administered 2017-11-15 (×2): 20 mg via INTRAVENOUS

## 2017-11-15 MED ORDER — PROPOFOL 10 MG/ML IV BOLUS
INTRAVENOUS | Status: AC
  Filled 2017-11-15: qty 60

## 2017-11-15 MED ORDER — SODIUM CHLORIDE 0.9 % IV SOLN: INTRAVENOUS | Status: DC

## 2017-11-15 MED ORDER — PROPOFOL 10 MG/ML IV BOLUS
INTRAVENOUS | Status: AC
  Filled 2017-11-15: qty 20

## 2017-11-15 MED ORDER — LACTATED RINGERS IV SOLN
INTRAVENOUS | Status: DC
  Administered 2017-11-15: 1000 mL via INTRAVENOUS

## 2017-11-15 MED ORDER — ONDANSETRON HCL 4 MG/2ML IJ SOLN
INTRAMUSCULAR | Status: DC | PRN
  Administered 2017-11-15: 4 mg via INTRAVENOUS

## 2017-11-15 MED ORDER — PROPOFOL 500 MG/50ML IV EMUL
INTRAVENOUS | Status: DC | PRN
  Administered 2017-11-15: 130 ug/kg/min via INTRAVENOUS

## 2017-11-15 MED ORDER — EPHEDRINE SULFATE-NACL 50-0.9 MG/10ML-% IV SOSY
PREFILLED_SYRINGE | INTRAVENOUS | Status: DC | PRN
  Administered 2017-11-15 (×3): 5 mg via INTRAVENOUS

## 2017-11-15 SURGICAL SUPPLY — 22 items

## 2017-11-15 NOTE — Discharge Instructions (Signed)
YOU HAD AN ENDOSCOPIC PROCEDURE TODAY: Refer to the procedure report and other information in the discharge instructions given to you for any specific questions about what was found during the examination. If this information does not answer your questions, please call [place] office at [phone] to clarify.  ° °YOU SHOULD EXPECT: Some feelings of bloating in the abdomen. Passage of more gas than usual. Walking can help get rid of the air that was put into your GI tract during the procedure and reduce the bloating. If you had a lower endoscopy (such as a colonoscopy or flexible sigmoidoscopy) you may notice spotting of blood in your stool or on the toilet paper. Some abdominal soreness may be present for a day or two, also. ° °DIET: Your first meal following the procedure should be a light meal and then it is ok to progress to your normal diet. A half-sandwich or bowl of soup is an example of a good first meal. Heavy or fried foods are harder to digest and may make you feel nauseous or bloated. Drink plenty of fluids but you should avoid alcoholic beverages for 24 hours. If you had a esophageal dilation, please see attached instructions for diet.   ° °ACTIVITY: Your care partner should take you home directly after the procedure. You should plan to take it easy, moving slowly for the rest of the day. You can resume normal activity the day after the procedure however YOU SHOULD NOT DRIVE, use power tools, machinery or perform tasks that involve climbing or major physical exertion for 24 hours (because of the sedation medicines used during the test).  ° °SYMPTOMS TO REPORT IMMEDIATELY: °A gastroenterologist can be reached at any hour. Please call [phone]  for any of the following symptoms:  °Following lower endoscopy (colonoscopy, flexible sigmoidoscopy) °Excessive amounts of blood in the stool  °Significant tenderness, worsening of abdominal pains  °Swelling of the abdomen that is new, acute  °Fever of 100° or  higher  °Following upper endoscopy (EGD, EUS, ERCP, esophageal dilation) °Vomiting of blood or coffee ground material  °New, significant abdominal pain  °New, significant chest pain or pain under the shoulder blades  °Painful or persistently difficult swallowing  °New shortness of breath  °Black, tarry-looking or red, bloody stools ° °FOLLOW UP:  °If any biopsies were taken you will be contacted by phone or by letter within the next 1-3 weeks. Call [phone]  if you have not heard about the biopsies in 3 weeks.  °Please also call with any specific questions about appointments or follow up tests. ° °

## 2017-11-15 NOTE — Transfer of Care (Signed)
Immediate Anesthesia Transfer of Care Note  Patient: Marcus Williamson  Procedure(s) Performed: COLONOSCOPY WITH PROPOFOL (N/A )  Patient Location: PACU and Endoscopy Unit  Anesthesia Type:MAC  Level of Consciousness: awake, alert , oriented and patient cooperative  Airway & Oxygen Therapy: Patient Spontanous Breathing and Patient connected to face mask oxygen  Post-op Assessment: Report given to RN, Post -op Vital signs reviewed and stable and Patient moving all extremities  Post vital signs: Reviewed and stable  Last Vitals:  Vitals:   11/15/17 0729  BP: 133/81  Pulse: (!) 59  Resp: 20  Temp: 36.8 C  SpO2: 96%    Last Pain:  Vitals:   11/15/17 0729  TempSrc: Oral         Complications: No apparent anesthesia complications

## 2017-11-15 NOTE — H&P (Signed)
HPI:   Marcus Williamson is a 67 y.o. male with a history of OSA, HTN, morbid obesity with BMI 52, weighing 356 lbs, here for screening colonoscopy. Case being done at hospital due to weight.   Past Medical History:  Diagnosis Date  . Adenomatous polyp of colon   . HTN (hypertension)   . Obstructive sleep apnea     Past Surgical History:  Procedure Laterality Date  . KNEE ARTHROSCOPY    . MASTOIDECTOMY     bilateral--but mostly on left  . TYMPANOSTOMY TUBE PLACEMENT      Family History  Problem Relation Age of Onset  . Alzheimer's disease Mother   . Heart disease Father   . Cancer Father        unknown primary     Social History   Tobacco Use  . Smoking status: Current Some Day Smoker    Types: Cigars  . Smokeless tobacco: Never Used  . Tobacco comment: occ cigar or cigarette--not daily  Substance Use Topics  . Alcohol use: Yes    Alcohol/week: 0.0 oz  . Drug use: No    Prior to Admission medications   Medication Sig Start Date End Date Taking? Authorizing Provider  atenolol (TENORMIN) 50 MG tablet TAKE 2 TABLETS (100 MG TOTAL) BY MOUTH DAILY. MUST SCHEDULE OFFICE VISIT 08/23/17  Yes Venia Carbon, MD  diltiazem (CARDIZEM SR) 90 MG 12 hr capsule TAKE 1 CAPSULE (90 MG TOTAL) BY MOUTH 2 (TWO) TIMES DAILY. 09/13/17  Yes Venia Carbon, MD  doxazosin (CARDURA) 8 MG tablet TAKE 1 TABLET (8 MG TOTAL) BY MOUTH DAILY. PLEASE SCHEDULE PHYSICAL EXAM 08/16/17  Yes Venia Carbon, MD  lisinopril-hydrochlorothiazide (PRINZIDE,ZESTORETIC) 20-12.5 MG tablet TAKE 1 TABLET BY MOUTH TWICE A DAY 08/16/17  Yes Venia Carbon, MD    Current Facility-Administered Medications  Medication Dose Route Frequency Provider Last Rate Last Dose  . 0.9 %  sodium chloride infusion   Intravenous Continuous Adira Limburg, Carlota Raspberry, MD      . lactated ringers infusion   Intravenous Continuous Havery Moros Carlota Raspberry, MD 10 mL/hr at 11/15/17 0758 1,000 mL at 11/15/17 0758     Allergies as of 09/13/2017  . (No Known Allergies)     Review of Systems:    As per HPI, otherwise negative    Physical Exam:  Vital signs in last 24 hours: Temp:  [98.2 F (36.8 C)] 98.2 F (36.8 C) (02/18 0729) Pulse Rate:  [59] 59 (02/18 0729) Resp:  [20] 20 (02/18 0729) BP: (133)/(81) 133/81 (02/18 0729) SpO2:  [96 %] 96 % (02/18 0729) Weight:  [356 lb (161.5 kg)] 356 lb (161.5 kg) (02/18 0753)   General:   Pleasant male in NAD Lungs:  Respirations even and unlabored. Lungs clear to auscultation bilaterally.   No wheezes, crackles, or rhonchi.  Heart:  Regular rate and rhythm;  Abdomen:  Soft, nondistended, nontender. .  Extremities:  Without edema.  LAB RESULTS: No results for input(s): WBC, HGB, HCT, PLT in the last 72 hours. BMET No results for input(s): NA, K, CL, CO2, GLUCOSE, BUN, CREATININE, CALCIUM in the last 72 hours. LFT No results for input(s): PROT, ALBUMIN, AST, ALT, ALKPHOS, BILITOT, BILIDIR, IBILI in the last 72 hours. PT/INR No results for input(s): LABPROT, INR in the last 72 hours.  STUDIES: No results found.      Impression / Plan:  67 y/o male with morbid obesity, BMI  52, here for screening colonoscopy. No symptoms reported. I discussed risks / benefits of colonoscopy and anesthesia with him, he wished to proceed. Further recommendations pending the results.   [place] Cellar, MD [facility] Pager [phone]

## 2017-11-15 NOTE — Interval H&P Note (Signed)
History and Physical Interval Note:  11/15/2017 8:07 AM  Marcus Williamson  has presented today for surgery, with the diagnosis of Colon cancer screening  The various methods of treatment have been discussed with the patient and family. After consideration of risks, benefits and other options for treatment, the patient has consented to  Procedure(s): COLONOSCOPY WITH PROPOFOL (N/A) as a surgical intervention .  The patient's history has been reviewed, patient examined, no change in status, stable for surgery.  I have reviewed the patient's chart and labs.  Questions were answered to the patient's satisfaction.     [place]

## 2017-11-15 NOTE — Anesthesia Postprocedure Evaluation (Signed)
Anesthesia Post Note  Patient: Marcus Williamson  Procedure(s) Performed: COLONOSCOPY WITH PROPOFOL (N/A )     Patient location during evaluation: PACU Anesthesia Type: MAC Level of consciousness: awake and alert Pain management: pain level controlled Vital Signs Assessment: post-procedure vital signs reviewed and stable Respiratory status: spontaneous breathing and respiratory function stable Cardiovascular status: stable Postop Assessment: no apparent nausea or vomiting Anesthetic complications: no    Last Vitals:  Vitals:   11/15/17 0910 11/15/17 0920  BP: 118/78 129/80  Pulse: (!) 56 (!) 50  Resp: 19 18  Temp:    SpO2: 95% 96%    Last Pain:  Vitals:   11/15/17 0852  TempSrc: Oral                 Makyia Erxleben DANIEL

## 2017-11-15 NOTE — Op Note (Signed)
[facility] Patient Name: Marcus Williamson Procedure Date: 11/15/2017 MRN: [id number] Attending MD: Carlota Raspberry. Justus Duerr MD, MD Date of Birth: 1951/01/27 CSN: [id number] Age: 67 Admit Type: Outpatient Procedure:                Colonoscopy Indications:              Screening for colorectal malignant neoplasm Providers:                Remo Lipps P. Arrow Emmerich MD, MD, Burtis Junes, RN, Charolette Child, Technician, William Dalton, Technician Referring MD:              Medicines:                Monitored Anesthesia Care Complications:            No immediate complications. Estimated blood loss:                            Minimal. Estimated Blood Loss:     Estimated blood loss was minimal. Procedure:                Pre-Anesthesia Assessment:                           - Prior to the procedure, a History and Physical                            was performed, and patient medications and                            allergies were reviewed. The patient's tolerance of                            previous anesthesia was also reviewed. The risks                            and benefits of the procedure and the sedation                            options and risks were discussed with the patient.                            All questions were answered, and informed consent                            was obtained. Prior Anticoagulants: The patient has                            taken no previous anticoagulant or antiplatelet                            agents. ASA Grade Assessment: III - A patient with  severe systemic disease. After reviewing the risks                            and benefits, the patient was deemed in                            satisfactory condition to undergo the procedure.                           After obtaining informed consent, the colonoscope                            was passed under direct vision. Throughout the                        procedure, the patient's blood pressure, pulse, and                            oxygen saturations were monitored continuously. The                            EC-[id number] ([id number]) scope was introduced through                            the anus and advanced to the the cecum, identified                            by appendiceal orifice and ileocecal valve. The                            colonoscopy was performed without difficulty. The                            patient tolerated the procedure well. The quality                            of the bowel preparation was good. The ileocecal                            valve, appendiceal orifice, and rectum were                            photographed. Scope In: 8:15:21 AM Scope Out: 8:44:19 AM Scope Withdrawal Time: 0 hours 25 minutes 40 seconds  Total Procedure Duration: 0 hours 28 minutes 58 seconds  Findings:      The perianal exam findings include a skin tag.      Two sessile polyps were found in the ascending colon. The polyps were 4       to 5 mm in size. These polyps were removed with a cold snare. Resection       and retrieval were complete.      Two sessile polyps were found in the transverse colon. The polyps were 4       to 5 mm in size. These polyps were removed with a cold snare. Resection  and retrieval were complete.      Two sessile polyps were found in the descending colon. The polyps were 4       to 5 mm in size. These polyps were removed with a cold snare. Resection       and retrieval were complete.      Internal hemorrhoids were found during retroflexion.      The exam was otherwise without abnormality. Impression:               - Perianal skin tag found on perianal exam.                           - Two 4 to 5 mm polyps in the ascending colon,                            removed with a cold snare. Resected and retrieved.                           - Two 4 to 5 mm polyps in the transverse colon,                             removed with a cold snare. Resected and retrieved.                           - Two 4 to 5 mm polyps in the descending colon,                            removed with a cold snare. Resected and retrieved.                           - Internal hemorrhoids.                           - The examination was otherwise normal. Moderate Sedation:      No moderate sedation, case performed with MAC Recommendation:           - Patient has a contact number available for                            emergencies. The signs and symptoms of potential                            delayed complications were discussed with the                            patient. Return to normal activities tomorrow.                            Written discharge instructions were provided to the                            patient.                           - Resume previous diet.                           -  Continue present medications.                           - Await pathology results.                           - Repeat colonoscopy is recommended for                            surveillance. The colonoscopy date will be                            determined after pathology results from today's                            exam become available for review.                           - No ibuprofen, naproxen, or other non-steroidal                            anti-inflammatory drugs for 2 weeks after polyp                            removal. Procedure Code(s):        --- Professional ---                           [phone], Colonoscopy, flexible; with removal of                            tumor(s), polyp(s), or other lesion(s) by snare                            technique Diagnosis Code(s):        --- Professional ---                           Z12.11, Encounter for screening for malignant                            neoplasm of colon                           D12.2, Benign neoplasm of ascending colon                            D12.3, Benign neoplasm of transverse colon (hepatic                            flexure or splenic flexure)                           D12.4, Benign neoplasm of descending colon                           K64.8, Other hemorrhoids  K64.4, Residual hemorrhoidal skin tags CPT copyright 2016 American Medical Association. All rights reserved. The codes documented in this report are preliminary and upon coder review may  be revised to meet current compliance requirements. Remo Lipps P. Alphonse Asbridge MD, MD 11/15/2017 8:49:26 AM This report has been signed electronically. Number of Addenda: 0

## 2017-11-16 ENCOUNTER — Encounter: Payer: Self-pay | Admitting: Gastroenterology

## 2017-11-18 ENCOUNTER — Encounter (HOSPITAL_COMMUNITY): Payer: Self-pay | Admitting: Gastroenterology

## 2017-11-22 ENCOUNTER — Other Ambulatory Visit: Payer: Self-pay | Admitting: Internal Medicine

## 2017-12-15 ENCOUNTER — Other Ambulatory Visit: Payer: Self-pay | Admitting: Internal Medicine

## 2017-12-24 ENCOUNTER — Telehealth: Payer: Self-pay | Admitting: Internal Medicine

## 2017-12-24 MED ORDER — DILTIAZEM HCL 60 MG PO TABS: 60.0000 mg | ORAL_TABLET | Freq: Three times a day (TID) | ORAL | 0 refills | Status: DC

## 2017-12-24 NOTE — Telephone Encounter (Signed)
After consulting w pharmacist, have sent in [place] 60mg  immediate release to take TID. Have sent in 1 month supply, keep trying to get his sustained release 90mg  dose.  plz notify pt.

## 2017-12-24 NOTE — Telephone Encounter (Signed)
Spoke with pt relaying message per Dr. Darnell Level.  Pt verbalizes understanding and expresses his thanks.

## 2017-12-24 NOTE — Telephone Encounter (Signed)
Pt called to check the status of the medication request, contact pt to update on status

## 2017-12-24 NOTE — Telephone Encounter (Signed)
Dr Silvio Pate is out of the office this afternoon. I will ask another provider if they have a suggestion.

## 2017-12-24 NOTE — Telephone Encounter (Signed)
Copied from [place]. Topic: Quick Communication - See Telephone Encounter >> Dec 24, 2017  1:44 PM Vernona Rieger wrote: CRM for notification. See Telephone encounter for: 12/24/17.  diltiazem (CARDIZEM SR) 90 MG 12 hr capsule, wife called and said they can not find it at any drug store. Must be on back order. What can he have in place of this? Please call back to let him know what can be called in to [facility]. They have been looking all week for it and now he is down to 2 pills.   [phone] Jerilynn Mages)

## 2017-12-25 NOTE — Telephone Encounter (Signed)
See if his insurance will cover a 24hr 180mg  dose

## 2017-12-27 NOTE — Telephone Encounter (Signed)
Spoke to pt. He does not want to do the extended release. He was told the 90mg  will be in today. He is fine with taking 3 60mg  daily for now.

## 2017-12-27 NOTE — Telephone Encounter (Signed)
Confirm with him that he wants to change to the once a day. Send Rx if he agrees

## 2017-12-27 NOTE — Telephone Encounter (Signed)
Spoke to [place] at [facility]. She said without an rx, she cannot run a claim. She did not want to do a test claim. I will have to call insurance.

## 2017-12-27 NOTE — Telephone Encounter (Signed)
Called [employer] and spoke to [place]. She ran a test claim for Diltiazem Extended Release 180mg  1 a day. They only cover [facility] # [id number]. We can send a rx to the pharmacy, but need to put in the notes the [facility].

## 2018-01-24 ENCOUNTER — Other Ambulatory Visit: Payer: Self-pay | Admitting: Internal Medicine

## 2018-01-25 ENCOUNTER — Other Ambulatory Visit: Payer: Self-pay | Admitting: Internal Medicine

## 2018-09-05 ENCOUNTER — Encounter: Payer: Self-pay | Admitting: Internal Medicine

## 2018-09-05 ENCOUNTER — Ambulatory Visit (INDEPENDENT_AMBULATORY_CARE_PROVIDER_SITE_OTHER): Payer: 59 | Admitting: Internal Medicine

## 2018-09-05 VITALS — BP 138/86 | HR 60 | Temp 98.0°F | Ht 70.0 in | Wt 332.0 lb

## 2018-09-05 DIAGNOSIS — Z23 Encounter for immunization: Secondary | ICD-10-CM | POA: Diagnosis not present

## 2018-09-05 DIAGNOSIS — I350 Nonrheumatic aortic (valve) stenosis: Secondary | ICD-10-CM

## 2018-09-05 DIAGNOSIS — G4733 Obstructive sleep apnea (adult) (pediatric): Secondary | ICD-10-CM

## 2018-09-05 DIAGNOSIS — I10 Essential (primary) hypertension: Secondary | ICD-10-CM

## 2018-09-05 DIAGNOSIS — Z0000 Encounter for general adult medical examination without abnormal findings: Secondary | ICD-10-CM | POA: Diagnosis not present

## 2018-09-05 LAB — COMPREHENSIVE METABOLIC PANEL
ALK PHOS: 45 U/L (ref 39–117)
ALT: 32 U/L (ref 0–53)
AST: 22 U/L (ref 0–37)
Albumin: 4.4 g/dL (ref 3.5–5.2)
BILIRUBIN TOTAL: 0.5 mg/dL (ref 0.2–1.2)
BUN: 22 mg/dL (ref 6–23)
CALCIUM: 9.7 mg/dL (ref 8.4–10.5)
CO2: 28 mEq/L (ref 19–32)
Chloride: 104 mEq/L (ref 96–112)
Creatinine, Ser: 0.98 mg/dL (ref 0.40–1.50)
GFR: 81.19 mL/min (ref >60.00)
Glucose, Bld: 115 mg/dL — ABNORMAL HIGH (ref 70–99)
Potassium: 4.3 mEq/L (ref 3.5–5.1)
Sodium: 138 mEq/L (ref 135–145)
Total Protein: 7.1 g/dL (ref 6.0–8.3)

## 2018-09-05 LAB — CBC
HCT: 42.6 % (ref 39.0–52.0)
Hemoglobin: 14.5 g/dL (ref 13.0–17.0)
MCHC: 34 g/dL (ref 30.0–36.0)
MCV: 93.8 fl (ref 78.0–100.0)
PLATELETS: 137 10*3/uL — AB (ref 150.0–400.0)
RBC: 4.54 Mil/uL (ref 4.22–5.81)
RDW: 13.6 % (ref 11.5–15.5)
WBC: 4.7 10*3/uL (ref 4.0–10.5)

## 2018-09-05 NOTE — Assessment & Plan Note (Signed)
Classic murmur  No symptoms Discussed echo--will hold off

## 2018-09-05 NOTE — Assessment & Plan Note (Signed)
BP Readings from Last 3 Encounters:  09/05/18 138/86  11/15/17 129/80  09/13/17 (!) 142/100   Good control No changes Due for labs

## 2018-09-05 NOTE — Assessment & Plan Note (Signed)
Has made some improvements

## 2018-09-05 NOTE — Progress Notes (Signed)
Subjective:    Patient ID: Marcus Williamson, male    DOB: Jan 06, 1951, 67 y.o.   MRN: [id number]  HPI Here for physical Still driving full time  Ongoing knee pain but nothing serious  Has lost 20+# since last year New truck--has microwave (so bringing food/portion control) Does a little walking---easier since he has lost weight  Did have spell of GI distress and diarrhea about a month ago Some testicle pain then and trouble emptying/burning Resolved on its own  Current Outpatient Medications on File Prior to Visit  Medication Sig Dispense Refill  . atenolol (TENORMIN) 50 MG tablet Take 2 tablets (100 mg total) by mouth daily. 180 tablet 3  . diltiazem (CARDIZEM) 60 MG tablet TAKE 1 TABLET BY MOUTH 3 TIMES A DAY 90 tablet 11  . doxazosin (CARDURA) 8 MG tablet Take 1 tablet (8 mg total) by mouth daily. 90 tablet 3  . lisinopril-hydrochlorothiazide (PRINZIDE,ZESTORETIC) 20-12.5 MG tablet TAKE 1 TABLET BY MOUTH TWICE A DAY 180 tablet 2   No current facility-administered medications on file prior to visit.     No Known Allergies  Past Medical History:  Diagnosis Date  . Adenomatous polyp of colon   . HTN (hypertension)   . Obstructive sleep apnea     Past Surgical History:  Procedure Laterality Date  . COLONOSCOPY WITH PROPOFOL N/A 11/15/2017   Procedure: COLONOSCOPY WITH PROPOFOL;  Surgeon: Yetta Flock, MD;  Location: [facility];  Service: Gastroenterology;  Laterality: N/A;  . KNEE ARTHROSCOPY    . MASTOIDECTOMY     bilateral--but mostly on left  . TYMPANOSTOMY TUBE PLACEMENT      Family History  Problem Relation Age of Onset  . Alzheimer's disease Mother   . Heart disease Father   . Cancer Father        unknown primary    Social History   Socioeconomic History  . Marital status: Married    Spouse name: Not on file  . Number of children: 1  . Years of education: Not on file  . Highest education level: Not on file  Occupational History  .  Occupation: [employer] secretary  Social Needs  . Financial resource strain: Not on file  . Food insecurity:    Worry: Not on file    Inability: Not on file  . Transportation needs:    Medical: Not on file    Non-medical: Not on file  Tobacco Use  . Smoking status: Current Some Day Smoker    Types: Cigars  . Smokeless tobacco: Never Used  . Tobacco comment: occ cigar or cigarette--not daily  Substance and Sexual Activity  . Alcohol use: Yes    Alcohol/week: 0.0 standard drinks  . Drug use: No  . Sexual activity: Not on file  Lifestyle  . Physical activity:    Days per week: Not on file    Minutes per session: Not on file  . Stress: Not on file  Relationships  . Social connections:    Talks on phone: Not on file    Gets together: Not on file    Attends religious service: Not on file    Active member of club or organization: Not on file    Attends meetings of clubs or organizations: Not on file    Relationship status: Not on file  . Intimate partner violence:    Fear of current or ex partner: Not on file    Emotionally abused: Not on file    Physically abused:  Not on file    Forced sexual activity: Not on file  Other Topics Concern  . Not on file  Social History Narrative  . Not on file   Review of Systems  Constitutional: Negative for fatigue.       Wears seat belt Still smokes an occasional cigar---keeps him from eating (discussed)  HENT: Positive for hearing loss and tinnitus. Negative for dental problem and trouble swallowing.        Hearing aides bilaterally Keeps up with dentist  Eyes: Negative for visual disturbance.       No diplopia or unilateral vision loss  Respiratory: Negative for cough, chest tightness and shortness of breath.   Cardiovascular: Negative for chest pain and palpitations.       Slight swelling at end of long drive  Gastrointestinal: Negative for blood in stool.       No heartburn Some constipation with his change in eating    Endocrine: Negative for polydipsia and polyuria.  Genitourinary: Positive for difficulty urinating. Negative for urgency.       No sexual problems  Musculoskeletal: Positive for arthralgias. Negative for joint swelling.       Knees and hands--- ibuprofen/tylenol prn   Skin: Negative for rash.       No suspicious lesions  Allergic/Immunologic: Negative for environmental allergies and immunocompromised state.  Neurological: Negative for dizziness, syncope, light-headedness and headaches.  Hematological: Negative for adenopathy. Bruises/bleeds easily.  Psychiatric/Behavioral: Negative for dysphoric mood and sleep disturbance. The patient is not nervous/anxious.        Sleeps with CPAP every night---helps       Objective:   Physical Exam  Constitutional: He appears well-developed. No distress.  HENT:  Head: Normocephalic and atraumatic.  Right Ear: External ear normal.  Left Ear: External ear normal.  Mouth/Throat: Oropharynx is clear and moist. No oropharyngeal exudate.  Eyes: Pupils are equal, round, and reactive to light. Conjunctivae are normal.  Neck: No thyromegaly present.  Cardiovascular: Normal rate, regular rhythm and intact distal pulses. Exam reveals no gallop.  Soft aortic systolic murmur  Respiratory: Effort normal and breath sounds normal. No respiratory distress. He has no wheezes. He has no rales.  GI: Soft. There is no tenderness.  Musculoskeletal: He exhibits no edema or tenderness.  Lymphadenopathy:    He has no cervical adenopathy.  Neurological: He is alert.  Skin: No rash noted. No erythema.  Psychiatric: He has a normal mood and affect. His behavior is normal.           Assessment & Plan:

## 2018-09-05 NOTE — Assessment & Plan Note (Signed)
Healthy Has lost some weight and working on lifestyle Colon due in 3-5 years Discussed PSA--will hold off (his decision) Pneumvax today Yearly flu vaccine shingrix when available

## 2018-09-05 NOTE — Addendum Note (Signed)
Addended by: Pilar Grammes on: 09/05/2018 11:45 AM   Modules accepted: Orders

## 2018-09-05 NOTE — Assessment & Plan Note (Signed)
Uses the CPAP nightly

## 2018-10-27 ENCOUNTER — Other Ambulatory Visit: Payer: Self-pay | Admitting: Internal Medicine

## 2018-10-28 ENCOUNTER — Other Ambulatory Visit: Payer: Self-pay | Admitting: Internal Medicine

## 2018-11-16 ENCOUNTER — Other Ambulatory Visit: Payer: Self-pay | Admitting: Internal Medicine

## 2018-11-18 ENCOUNTER — Other Ambulatory Visit: Payer: Self-pay | Admitting: Internal Medicine

## 2019-09-11 ENCOUNTER — Ambulatory Visit (INDEPENDENT_AMBULATORY_CARE_PROVIDER_SITE_OTHER): Payer: 59 | Admitting: Internal Medicine

## 2019-09-11 ENCOUNTER — Encounter: Payer: Self-pay | Admitting: Internal Medicine

## 2019-09-11 ENCOUNTER — Other Ambulatory Visit: Payer: Self-pay

## 2019-09-11 VITALS — BP 124/86 | HR 55 | Temp 97.5°F | Ht 70.0 in | Wt 337.0 lb

## 2019-09-11 DIAGNOSIS — I10 Essential (primary) hypertension: Secondary | ICD-10-CM | POA: Diagnosis not present

## 2019-09-11 DIAGNOSIS — Z125 Encounter for screening for malignant neoplasm of prostate: Secondary | ICD-10-CM

## 2019-09-11 DIAGNOSIS — Z0000 Encounter for general adult medical examination without abnormal findings: Secondary | ICD-10-CM | POA: Diagnosis not present

## 2019-09-11 DIAGNOSIS — I350 Nonrheumatic aortic (valve) stenosis: Secondary | ICD-10-CM | POA: Diagnosis not present

## 2019-09-11 DIAGNOSIS — R7301 Impaired fasting glucose: Secondary | ICD-10-CM

## 2019-09-11 LAB — SARS-COV-2 IGG: SARS-COV-2 IgG: 0.03

## 2019-09-11 LAB — COMPREHENSIVE METABOLIC PANEL
ALT: 31 U/L (ref 0–53)
AST: 20 U/L (ref 0–37)
Albumin: 4.3 g/dL (ref 3.5–5.2)
Alkaline Phosphatase: 42 U/L (ref 39–117)
BUN: 24 mg/dL — ABNORMAL HIGH (ref 6–23)
CO2: 28 mEq/L (ref 19–32)
Calcium: 9.5 mg/dL (ref 8.4–10.5)
Chloride: 104 mEq/L (ref 96–112)
Creatinine, Ser: 1.17 mg/dL (ref 0.40–1.50)
GFR: 62.07 mL/min (ref >60.00)
Glucose, Bld: 115 mg/dL — ABNORMAL HIGH (ref 70–99)
Potassium: 4.6 mEq/L (ref 3.5–5.1)
Sodium: 139 mEq/L (ref 135–145)
Total Bilirubin: 0.5 mg/dL (ref 0.2–1.2)
Total Protein: 6.6 g/dL (ref 6.0–8.3)

## 2019-09-11 LAB — CBC
HCT: 41.7 % (ref 39.0–52.0)
Hemoglobin: 14.1 g/dL (ref 13.0–17.0)
MCHC: 33.8 g/dL (ref 30.0–36.0)
MCV: 94.4 fl (ref 78.0–100.0)
Platelets: 126 10*3/uL — ABNORMAL LOW (ref 150.0–400.0)
RBC: 4.42 Mil/uL (ref 4.22–5.81)
RDW: 13.4 % (ref 11.5–15.5)
WBC: 4.8 10*3/uL (ref 4.0–10.5)

## 2019-09-11 LAB — PSA: PSA: 0.5 ng/mL (ref 0.10–4.00)

## 2019-09-11 LAB — HEMOGLOBIN A1C: Hgb A1c MFr Bld: 5.7 % (ref 4.6–6.5)

## 2019-09-11 LAB — T4, FREE: Free T4: 0.8 ng/dL (ref 0.60–1.60)

## 2019-09-11 NOTE — Assessment & Plan Note (Signed)
Did gain back a little Will work on lifestyle (COVID not easy)

## 2019-09-11 NOTE — Assessment & Plan Note (Signed)
No symptoms Hold off on echo for now

## 2019-09-11 NOTE — Progress Notes (Signed)
Subjective:    Patient ID: Marcus Williamson, male    DOB: 1951/05/03, 68 y.o.   MRN: [id number]  HPI Here for physical  Has been using a cane lately to walk Slipped and fell about 3 weeks ago Back was very bad---muscular Used lidocaine patches Slowly improving--but really a struggle Didn't miss work--but needed help occasionally to deal with loads  Has gained back 5#---had gained more but lost some Still has microwave---stays in the truck other than for restroom, unloading, fuel Thinks he may have been exposed to [place] back in February  Current Outpatient Medications on File Prior to Visit  Medication Sig Dispense Refill  . atenolol (TENORMIN) 50 MG tablet TAKE 2 TABLETS BY MOUTH EVERY DAY 180 tablet 3  . diltiazem (CARDIZEM) 60 MG tablet TAKE 1 TABLET BY MOUTH 3 TIMES A DAY 270 tablet 3  . doxazosin (CARDURA) 8 MG tablet TAKE 1 TABLET (8 MG TOTAL) BY MOUTH DAILY. 90 tablet 3  . lisinopril-hydrochlorothiazide (PRINZIDE,ZESTORETIC) 20-12.5 MG tablet TAKE 1 TABLET BY MOUTH TWICE A DAY 180 tablet 3   No current facility-administered medications on file prior to visit.    No Known Allergies  Past Medical History:  Diagnosis Date  . Adenomatous polyp of colon   . HTN (hypertension)   . Obstructive sleep apnea     Past Surgical History:  Procedure Laterality Date  . COLONOSCOPY WITH PROPOFOL N/A 11/15/2017   Procedure: COLONOSCOPY WITH PROPOFOL;  Surgeon: Yetta Flock, MD;  Location: [facility];  Service: Gastroenterology;  Laterality: N/A;  . KNEE ARTHROSCOPY    . MASTOIDECTOMY     bilateral--but mostly on left  . TYMPANOSTOMY TUBE PLACEMENT      Family History  Problem Relation Age of Onset  . Alzheimer's disease Mother   . Heart disease Father   . Cancer Father        unknown primary    Social History   Socioeconomic History  . Marital status: Married    Spouse name: Not on file  . Number of children: 1  . Years of education: Not on file  . Highest  education level: Not on file  Occupational History  . Occupation: [employer] secretary  Tobacco Use  . Smoking status: Current Some Day Smoker    Types: Cigars  . Smokeless tobacco: Never Used  . Tobacco comment: occ cigar or cigarette--not daily  Substance and Sexual Activity  . Alcohol use: Yes    Alcohol/week: 0.0 standard drinks  . Drug use: No  . Sexual activity: Not on file  Other Topics Concern  . Not on file  Social History Narrative  . Not on file   Social Determinants of Health   Financial Resource Strain:   . Difficulty of Paying Living Expenses: Not on file  Food Insecurity:   . Worried About Charity fundraiser in the Last Year: Not on file  . Ran Out of Food in the Last Year: Not on file  Transportation Needs:   . Lack of Transportation (Medical): Not on file  . Lack of Transportation (Non-Medical): Not on file  Physical Activity:   . Days of Exercise per Week: Not on file  . Minutes of Exercise per Session: Not on file  Stress:   . Feeling of Stress : Not on file  Social Connections:   . Frequency of Communication with Friends and Family: Not on file  . Frequency of Social Gatherings with Friends and Family: Not on file  .  Attends Religious Services: Not on file  . Active Member of Clubs or Organizations: Not on file  . Attends Archivist Meetings: Not on file  . Marital Status: Not on file  Intimate Partner Violence:   . Fear of Current or Ex-Partner: Not on file  . Emotionally Abused: Not on file  . Physically Abused: Not on file  . Sexually Abused: Not on file   Review of Systems  Constitutional: Negative for fatigue and unexpected weight change.       Still smokes a cigar a day---discussed Wears seat belt   HENT: Positive for hearing loss and tinnitus.        Hearing aides bilaterally Keeps up with dentist  Eyes: Negative for visual disturbance.       No diplopia or unilateral vision  Respiratory: Negative for cough, chest tightness  and shortness of breath.   Cardiovascular: Negative for chest pain and palpitations.       Some stable leg swelling  Gastrointestinal: Negative for abdominal pain, blood in stool and constipation.       No heartburn  Endocrine: Negative for polydipsia and polyuria.  Genitourinary: Negative for difficulty urinating and urgency.       No sexual problems  Musculoskeletal: Positive for arthralgias and back pain. Negative for joint swelling.  Skin: Negative for rash.       No suspicious lesions  Allergic/Immunologic: Negative for environmental allergies and immunocompromised state.  Neurological: Negative for dizziness, syncope, light-headedness and headaches.  Hematological: Bruises/bleeds easily.  Psychiatric/Behavioral: Negative for sleep disturbance.       Uses the CPAP every night---takes it with him in the truck Stress with COVID---worries about being exposed (but just can't retire yet)       Objective:   Physical Exam  Constitutional: He is oriented to person, place, and time. He appears well-developed. No distress.  HENT:  Head: Normocephalic and atraumatic.  Right Ear: External ear normal.  Left Ear: External ear normal.  Mouth/Throat: Oropharynx is clear and moist. No oropharyngeal exudate.  Eyes: Pupils are equal, round, and reactive to light. Conjunctivae are normal.  Neck: No thyromegaly present.  Cardiovascular: Normal rate, regular rhythm and intact distal pulses. Exam reveals no gallop.  Gr 2/6 aortic systolic murmur  Respiratory: Effort normal and breath sounds normal. No respiratory distress. He has no wheezes. He has no rales.  GI: Soft. There is no abdominal tenderness.  Musculoskeletal:        General: No tenderness or edema.     Comments: Normal ROM in hips  Lymphadenopathy:    He has no cervical adenopathy.  Neurological: He is alert and oriented to person, place, and time.  Skin: No rash noted. No erythema.  Psychiatric: He has a normal mood and affect. His  behavior is normal.           Assessment & Plan:

## 2019-09-11 NOTE — Assessment & Plan Note (Signed)
BP Readings from Last 3 Encounters:  09/11/19 124/86  09/05/18 138/86  11/15/17 129/80   Good control Will check labs

## 2019-09-11 NOTE — Assessment & Plan Note (Signed)
Will check labs

## 2019-09-11 NOTE — Assessment & Plan Note (Signed)
Healthy but still out of shape Discussed cutting down on cigars Flu vaccine yearly--had already Colon due 2022 Will check PSA--after discussion Discussed fitness

## 2019-09-12 ENCOUNTER — Encounter: Payer: Self-pay | Admitting: Internal Medicine

## 2019-09-12 DIAGNOSIS — D696 Thrombocytopenia, unspecified: Secondary | ICD-10-CM | POA: Insufficient documentation

## 2019-11-04 ENCOUNTER — Other Ambulatory Visit: Payer: Self-pay | Admitting: Internal Medicine

## 2019-11-07 ENCOUNTER — Other Ambulatory Visit: Payer: Self-pay | Admitting: Internal Medicine

## 2019-11-12 ENCOUNTER — Other Ambulatory Visit: Payer: Self-pay | Admitting: Internal Medicine

## 2019-12-24 ENCOUNTER — Other Ambulatory Visit: Payer: Self-pay | Admitting: Internal Medicine

## 2020-09-16 ENCOUNTER — Other Ambulatory Visit: Payer: Self-pay

## 2020-09-16 ENCOUNTER — Ambulatory Visit (INDEPENDENT_AMBULATORY_CARE_PROVIDER_SITE_OTHER): Payer: 59 | Admitting: Internal Medicine

## 2020-09-16 ENCOUNTER — Encounter: Payer: 59 | Admitting: Internal Medicine

## 2020-09-16 ENCOUNTER — Encounter: Payer: Self-pay | Admitting: Internal Medicine

## 2020-09-16 VITALS — BP 136/88 | HR 47 | Temp 97.1°F | Ht 70.0 in | Wt 344.0 lb

## 2020-09-16 DIAGNOSIS — I10 Essential (primary) hypertension: Secondary | ICD-10-CM

## 2020-09-16 DIAGNOSIS — Z0000 Encounter for general adult medical examination without abnormal findings: Secondary | ICD-10-CM

## 2020-09-16 DIAGNOSIS — I350 Nonrheumatic aortic (valve) stenosis: Secondary | ICD-10-CM

## 2020-09-16 DIAGNOSIS — D696 Thrombocytopenia, unspecified: Secondary | ICD-10-CM

## 2020-09-16 DIAGNOSIS — R7303 Prediabetes: Secondary | ICD-10-CM

## 2020-09-16 DIAGNOSIS — G4733 Obstructive sleep apnea (adult) (pediatric): Secondary | ICD-10-CM | POA: Diagnosis not present

## 2020-09-16 NOTE — Assessment & Plan Note (Signed)
Does use the CPAP every night--has unit in his truck also

## 2020-09-16 NOTE — Progress Notes (Signed)
Hearing Screening   125Hz  250Hz  500Hz  1000Hz  2000Hz  3000Hz  4000Hz  6000Hz  8000Hz   Right ear:           Left ear:           Comments: Has hearing aids. Wearing them today.   Visual Acuity Screening   Right eye Left eye Both eyes  Without correction:     With correction: 20/15 20/15 20/15

## 2020-09-16 NOTE — Assessment & Plan Note (Signed)
Vs sclerosis Will check echo when he retires--scheduling is difficult with his job and no symptoms

## 2020-09-16 NOTE — Assessment & Plan Note (Signed)
BP Readings from Last 3 Encounters:  09/16/20 136/88  09/11/19 124/86  09/05/18 138/86   Good control on atenolol, diltiazem, lisinopril/HCTZ, doxazosin Will check labs

## 2020-09-16 NOTE — Assessment & Plan Note (Signed)
Discussed lifestyle.

## 2020-09-16 NOTE — Assessment & Plan Note (Signed)
Healthy but still not good shape Urged him to stop cigars Flu vaccine yearly Colon due in February Will defer PSA --just done last year Discussed exercise (though gave handicapped permit)

## 2020-09-16 NOTE — Assessment & Plan Note (Signed)
Will consider metformin if sugar higher

## 2020-09-16 NOTE — Progress Notes (Signed)
Subjective:    Patient ID: Marcus Williamson, male    DOB: November 18, 1950, 69 y.o.   MRN: [id number]  HPI Here for physical This visit occurred during the SARS-CoV-2 public health emergency.  Safety protocols were in place, including screening questions prior to the visit, additional usage of staff PPE, and extensive cleaning of exam room while observing appropriate contact time as indicated for disinfecting solutions.   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 Back is better Now with more trouble with knees Wants renewal of handicapped permit--can be a long way in truck and this is difficult  Has numbness in right hand Ulnar from wrist to fingers Could be related to lump at elbow and keeping arm on window of truck Some left shoulder pain Heat helps  Has 2 CPAPs --that have been recalled There are no replacement units now He uses it every single night --even when in the truck He is just going to wait for the replacement  Tries to eat healthy---like [employer] in truck No soft drinks Physically active doing his loads on flat bed---but then sitting  Current Outpatient Medications on File Prior to Visit  Medication Sig Dispense Refill  . atenolol (TENORMIN) 50 MG tablet TAKE 2 TABLETS BY MOUTH EVERY DAY 180 tablet 3  . diltiazem (CARDIZEM) 60 MG tablet TAKE 1 TABLET BY MOUTH THREE TIMES A DAY 270 tablet 3  . doxazosin (CARDURA) 8 MG tablet TAKE 1 TABLET BY MOUTH EVERY DAY 90 tablet 3  . lisinopril-hydrochlorothiazide (ZESTORETIC) 20-12.5 MG tablet TAKE 1 TABLET BY MOUTH TWICE A DAY 180 tablet 3   No current facility-administered medications on file prior to visit.    No Known Allergies  Past Medical History:  Diagnosis Date  . Adenomatous polyp of colon   . HTN (hypertension)   . Obstructive sleep apnea   . Thrombocytopenia ([place])     Past Surgical History:  Procedure Laterality Date  . COLONOSCOPY WITH PROPOFOL N/A 11/15/2017   Procedure: COLONOSCOPY WITH PROPOFOL;   Surgeon: Yetta Flock, MD;  Location: [facility];  Service: Gastroenterology;  Laterality: N/A;  . KNEE ARTHROSCOPY    . MASTOIDECTOMY     bilateral--but mostly on left  . TYMPANOSTOMY TUBE PLACEMENT      Family History  Problem Relation Age of Onset  . Alzheimer's disease Mother   . Heart disease Father   . Cancer Father        unknown primary    Social History   Socioeconomic History  . Marital status: Married    Spouse name: Not on file  . Number of children: 1  . Years of education: Not on file  . Highest education level: Not on file  Occupational History  . Occupation: [employer] secretary  Tobacco Use  . Smoking status: Current Some Day Smoker    Types: Cigars  . Smokeless tobacco: Never Used  . Tobacco comment: occ cigar or cigarette--not daily  Substance and Sexual Activity  . Alcohol use: Yes    Alcohol/week: 0.0 standard drinks  . Drug use: No  . Sexual activity: Not on file  Other Topics Concern  . Not on file  Social History Narrative  . Not on file   Social Determinants of Health   Financial Resource Strain: Not on file  Food Insecurity: Not on file  Transportation Needs: Not on file  Physical Activity: Not on file  Stress: Not on file  Social Connections: Not on file  Intimate Partner Violence:  Not on file   Review of Systems  Constitutional: Negative for fatigue.       Weight is up a bit since last year--but not higher than in the past Wears seat belt  HENT: Positive for tinnitus. Negative for dental problem and trouble swallowing.        Hearing aides do help Keeps up with dentist  Eyes: Negative for visual disturbance.       No diplopia or unilateral vision loss  Respiratory: Negative for cough, chest tightness and shortness of breath.   Cardiovascular: Positive for leg swelling. Negative for chest pain and palpitations.  Gastrointestinal: Negative for blood in stool.       No heartburn May have constipation and secondary back  pain at times  Genitourinary: Positive for urgency. Negative for difficulty urinating.       No sexual problems  Musculoskeletal: Positive for arthralgias. Negative for back pain and joint swelling.  Skin: Negative for rash.  Allergic/Immunologic: Negative for environmental allergies and immunocompromised state.  Neurological: Negative for dizziness, syncope, light-headedness and headaches.  Hematological: Negative for adenopathy. Bruises/bleeds easily.  Psychiatric/Behavioral: Negative for dysphoric mood. The patient is not nervous/anxious.        Objective:   Physical Exam Constitutional:      Appearance: He is obese.  HENT:     Right Ear: External ear normal.     Left Ear: External ear normal.     Ears:     Comments: Hearing aides    Mouth/Throat:     Pharynx: No oropharyngeal exudate or posterior oropharyngeal erythema.  Eyes:     Conjunctiva/sclera: Conjunctivae normal.     Pupils: Pupils are equal, round, and reactive to light.  Cardiovascular:     Rate and Rhythm: Normal rate and regular rhythm.     Pulses: Normal pulses.     Heart sounds: No gallop.      Comments: Soft aortic systolic murmur Pulmonary:     Effort: Pulmonary effort is normal.     Breath sounds: Normal breath sounds. No wheezing or rales.  Abdominal:     Palpations: Abdomen is soft.     Tenderness: There is no abdominal tenderness.  Musculoskeletal:     Cervical back: Neck supple.     Right lower leg: No edema.     Left lower leg: No edema.  Lymphadenopathy:     Cervical: No cervical adenopathy.  Skin:    General: Skin is warm.     Findings: No rash.  Neurological:     General: No focal deficit present.     Mental Status: He is alert and oriented to person, place, and time.  Psychiatric:        Mood and Affect: Mood normal.        Behavior: Behavior normal.            Assessment & Plan:

## 2020-09-16 NOTE — Patient Instructions (Signed)
DASH Eating Plan DASH stands for "Dietary Approaches to Stop Hypertension." The DASH eating plan is a healthy eating plan that has been shown to reduce high blood pressure (hypertension). It may also reduce your risk for type 2 diabetes, heart disease, and stroke. The DASH eating plan may also help with weight loss. What are tips for following this plan?  General guidelines  Avoid eating more than 2,300 mg (milligrams) of salt (sodium) a day. If you have hypertension, you may need to reduce your sodium intake to 1,500 mg a day.  Limit alcohol intake to no more than 1 drink a day for nonpregnant women and 2 drinks a day for men. One drink equals 12 oz of beer, 5 oz of wine, or 1 oz of hard liquor.  Work with your health care provider to maintain a healthy body weight or to lose weight. Ask what an ideal weight is for you.  Get at least 30 minutes of exercise that causes your heart to beat faster (aerobic exercise) most days of the week. Activities may include walking, swimming, or biking.  Work with your health care provider or diet and nutrition specialist (dietitian) to adjust your eating plan to your individual calorie needs. Reading food labels   Check food labels for the amount of sodium per serving. Choose foods with less than 5 percent of the Daily Value of sodium. Generally, foods with less than 300 mg of sodium per serving fit into this eating plan.  To find whole grains, look for the word "whole" as the first word in the ingredient list. Shopping  Buy products labeled as "low-sodium" or "no salt added."  Buy fresh foods. Avoid canned foods and premade or frozen meals. Cooking  Avoid adding salt when cooking. Use salt-free seasonings or herbs instead of table salt or sea salt. Check with your health care provider or pharmacist before using salt substitutes.  Do not fry foods. Cook foods using healthy methods such as baking, boiling, grilling, and broiling instead.  Cook with  heart-healthy oils, such as olive, canola, soybean, or sunflower oil. Meal planning  Eat a balanced diet that includes: ? 5 or more servings of fruits and vegetables each day. At each meal, try to fill half of your plate with fruits and vegetables. ? Up to 6-8 servings of whole grains each day. ? Less than 6 oz of lean meat, poultry, or fish each day. A 3-oz serving of meat is about the same size as a deck of cards. One egg equals 1 oz. ? 2 servings of low-fat dairy each day. ? A serving of nuts, seeds, or beans 5 times each week. ? Heart-healthy fats. Healthy fats called Omega-3 fatty acids are found in foods such as flaxseeds and coldwater fish, like sardines, salmon, and mackerel.  Limit how much you eat of the following: ? Canned or prepackaged foods. ? Food that is high in trans fat, such as fried foods. ? Food that is high in saturated fat, such as fatty meat. ? Sweets, desserts, sugary drinks, and other foods with added sugar. ? Full-fat dairy products.  Do not salt foods before eating.  Try to eat at least 2 vegetarian meals each week.  Eat more home-cooked food and less restaurant, buffet, and fast food.  When eating at a restaurant, ask that your food be prepared with less salt or no salt, if possible. What foods are recommended? The items listed may not be a complete list. Talk with your dietitian about   what dietary choices are best for you. Grains Whole-grain or whole-wheat bread. Whole-grain or whole-wheat pasta. Brown rice. Oatmeal. Quinoa. Bulgur. Whole-grain and low-sodium cereals. Pita bread. Low-fat, low-sodium crackers. Whole-wheat flour tortillas. Vegetables Fresh or frozen vegetables (raw, steamed, roasted, or grilled). Low-sodium or reduced-sodium tomato and vegetable juice. Low-sodium or reduced-sodium tomato sauce and tomato paste. Low-sodium or reduced-sodium canned vegetables. Fruits All fresh, dried, or frozen fruit. Canned fruit in natural juice (without  added sugar). Meat and other protein foods Skinless chicken or turkey. Ground chicken or turkey. Pork with fat trimmed off. Fish and seafood. Egg whites. Dried beans, peas, or lentils. Unsalted nuts, nut butters, and seeds. Unsalted canned beans. Lean cuts of beef with fat trimmed off. Low-sodium, lean deli meat. Dairy Low-fat (1%) or fat-free (skim) milk. Fat-free, low-fat, or reduced-fat cheeses. Nonfat, low-sodium ricotta or cottage cheese. Low-fat or nonfat yogurt. Low-fat, low-sodium cheese. Fats and oils Soft margarine without trans fats. Vegetable oil. Low-fat, reduced-fat, or light mayonnaise and salad dressings (reduced-sodium). Canola, safflower, olive, soybean, and sunflower oils. Avocado. Seasoning and other foods Herbs. Spices. Seasoning mixes without salt. Unsalted popcorn and pretzels. Fat-free sweets. What foods are not recommended? The items listed may not be a complete list. Talk with your dietitian about what dietary choices are best for you. Grains Baked goods made with fat, such as croissants, muffins, or some breads. Dry pasta or rice meal packs. Vegetables Creamed or fried vegetables. Vegetables in a cheese sauce. Regular canned vegetables (not low-sodium or reduced-sodium). Regular canned tomato sauce and paste (not low-sodium or reduced-sodium). Regular tomato and vegetable juice (not low-sodium or reduced-sodium). Pickles. Olives. Fruits Canned fruit in a light or heavy syrup. Fried fruit. Fruit in cream or butter sauce. Meat and other protein foods Fatty cuts of meat. Ribs. Fried meat. Bacon. Sausage. Bologna and other processed lunch meats. Salami. Fatback. Hotdogs. Bratwurst. Salted nuts and seeds. Canned beans with added salt. Canned or smoked fish. Whole eggs or egg yolks. Chicken or turkey with skin. Dairy Whole or 2% milk, cream, and half-and-half. Whole or full-fat cream cheese. Whole-fat or sweetened yogurt. Full-fat cheese. Nondairy creamers. Whipped toppings.  Processed cheese and cheese spreads. Fats and oils Butter. Stick margarine. Lard. Shortening. Ghee. Bacon fat. Tropical oils, such as coconut, palm kernel, or palm oil. Seasoning and other foods Salted popcorn and pretzels. Onion salt, garlic salt, seasoned salt, table salt, and sea salt. Worcestershire sauce. Tartar sauce. Barbecue sauce. Teriyaki sauce. Soy sauce, including reduced-sodium. Steak sauce. Canned and packaged gravies. Fish sauce. Oyster sauce. Cocktail sauce. Horseradish that you find on the shelf. Ketchup. Mustard. Meat flavorings and tenderizers. Bouillon cubes. Hot sauce and Tabasco sauce. Premade or packaged marinades. Premade or packaged taco seasonings. Relishes. Regular salad dressings. Where to find more information:  National Heart, Lung, and Blood Institute: www.nhlbi.nih.gov  American Heart Association: www.heart.org Summary  The DASH eating plan is a healthy eating plan that has been shown to reduce high blood pressure (hypertension). It may also reduce your risk for type 2 diabetes, heart disease, and stroke.  With the DASH eating plan, you should limit salt (sodium) intake to 2,300 mg a day. If you have hypertension, you may need to reduce your sodium intake to 1,500 mg a day.  When on the DASH eating plan, aim to eat more fresh fruits and vegetables, whole grains, lean proteins, low-fat dairy, and heart-healthy fats.  Work with your health care provider or diet and nutrition specialist (dietitian) to adjust your eating plan to your   individual calorie needs. This information is not intended to replace advice given to you by your health care provider. Make sure you discuss any questions you have with your health care provider. Document Revised: 08/27/2017 Document Reviewed: 09/07/2016 Elsevier Patient Education  2020 Elsevier Inc.  

## 2020-09-16 NOTE — Assessment & Plan Note (Signed)
Mildly low No abnormal bleeding

## 2020-09-17 LAB — COMPREHENSIVE METABOLIC PANEL
ALT: 42 U/L (ref 0–53)
AST: 24 U/L (ref 0–37)
Albumin: 4.5 g/dL (ref 3.5–5.2)
Alkaline Phosphatase: 52 U/L (ref 39–117)
BUN: 25 mg/dL — ABNORMAL HIGH (ref 6–23)
CO2: 28 mEq/L (ref 19–32)
Calcium: 9.8 mg/dL (ref 8.4–10.5)
Chloride: 102 mEq/L (ref 96–112)
Creatinine, Ser: 1.15 mg/dL (ref 0.40–1.50)
GFR: 65.35 mL/min (ref >60.00)
Glucose, Bld: 90 mg/dL (ref 70–99)
Potassium: 4.2 mEq/L (ref 3.5–5.1)
Sodium: 137 mEq/L (ref 135–145)
Total Bilirubin: 0.5 mg/dL (ref 0.2–1.2)
Total Protein: 7.3 g/dL (ref 6.0–8.3)

## 2020-09-17 LAB — LIPID PANEL
Cholesterol: 211 mg/dL — ABNORMAL HIGH (ref 0–200)
HDL: 44.4 mg/dL (ref >39.00)
LDL Cholesterol: 133 mg/dL — ABNORMAL HIGH (ref 0–99)
NonHDL: 166.42
Total CHOL/HDL Ratio: 5
Triglycerides: 167 mg/dL — ABNORMAL HIGH (ref 0.0–149.0)
VLDL: 33.4 mg/dL (ref 0.0–40.0)

## 2020-09-17 LAB — CBC
HCT: 42.4 % (ref 39.0–52.0)
Hemoglobin: 14.5 g/dL (ref 13.0–17.0)
MCHC: 34.2 g/dL (ref 30.0–36.0)
MCV: 93.7 fl (ref 78.0–100.0)
Platelets: 150 10*3/uL (ref 150.0–400.0)
RBC: 4.53 Mil/uL (ref 4.22–5.81)
RDW: 13.2 % (ref 11.5–15.5)
WBC: 5.3 10*3/uL (ref 4.0–10.5)

## 2020-09-17 LAB — HEMOGLOBIN A1C: Hgb A1c MFr Bld: 5.8 % (ref 4.6–6.5)

## 2020-11-08 ENCOUNTER — Other Ambulatory Visit: Payer: Self-pay | Admitting: Internal Medicine

## 2020-12-06 ENCOUNTER — Encounter: Payer: Self-pay | Admitting: Gastroenterology

## 2020-12-06 ENCOUNTER — Other Ambulatory Visit: Payer: Self-pay | Admitting: Internal Medicine

## 2020-12-15 ENCOUNTER — Other Ambulatory Visit: Payer: Self-pay | Admitting: Internal Medicine

## 2021-09-17 ENCOUNTER — Ambulatory Visit (INDEPENDENT_AMBULATORY_CARE_PROVIDER_SITE_OTHER): Payer: 59 | Admitting: Internal Medicine

## 2021-09-17 ENCOUNTER — Encounter: Payer: Self-pay | Admitting: Internal Medicine

## 2021-09-17 ENCOUNTER — Encounter: Payer: Self-pay | Admitting: Neurology

## 2021-09-17 ENCOUNTER — Other Ambulatory Visit: Payer: Self-pay

## 2021-09-17 ENCOUNTER — Ambulatory Visit (INDEPENDENT_AMBULATORY_CARE_PROVIDER_SITE_OTHER): Payer: 59

## 2021-09-17 VITALS — BP 128/80 | HR 47 | Temp 96.9°F | Ht 70.0 in | Wt 354.0 lb

## 2021-09-17 DIAGNOSIS — Z0000 Encounter for general adult medical examination without abnormal findings: Secondary | ICD-10-CM

## 2021-09-17 DIAGNOSIS — Z125 Encounter for screening for malignant neoplasm of prostate: Secondary | ICD-10-CM

## 2021-09-17 DIAGNOSIS — G4733 Obstructive sleep apnea (adult) (pediatric): Secondary | ICD-10-CM

## 2021-09-17 DIAGNOSIS — G5622 Lesion of ulnar nerve, left upper limb: Secondary | ICD-10-CM | POA: Diagnosis not present

## 2021-09-17 DIAGNOSIS — I10 Essential (primary) hypertension: Secondary | ICD-10-CM | POA: Diagnosis not present

## 2021-09-17 DIAGNOSIS — I350 Nonrheumatic aortic (valve) stenosis: Secondary | ICD-10-CM

## 2021-09-17 LAB — CBC
HCT: 40.9 % (ref 39.0–52.0)
Hemoglobin: 13.6 g/dL (ref 13.0–17.0)
MCHC: 33.3 g/dL (ref 30.0–36.0)
MCV: 94.3 fl (ref 78.0–100.0)
Platelets: 135 10*3/uL — ABNORMAL LOW (ref 150.0–400.0)
RBC: 4.34 Mil/uL (ref 4.22–5.81)
RDW: 13.8 % (ref 11.5–15.5)
WBC: 5.4 10*3/uL (ref 4.0–10.5)

## 2021-09-17 LAB — COMPREHENSIVE METABOLIC PANEL
ALT: 35 U/L (ref 0–53)
AST: 20 U/L (ref 0–37)
Albumin: 4.4 g/dL (ref 3.5–5.2)
Alkaline Phosphatase: 50 U/L (ref 39–117)
BUN: 23 mg/dL (ref 6–23)
CO2: 28 mEq/L (ref 19–32)
Calcium: 9.6 mg/dL (ref 8.4–10.5)
Chloride: 103 mEq/L (ref 96–112)
Creatinine, Ser: 1.02 mg/dL (ref 0.40–1.50)
GFR: 74.94 mL/min (ref >60.00)
Glucose, Bld: 108 mg/dL — ABNORMAL HIGH (ref 70–99)
Potassium: 4.2 mEq/L (ref 3.5–5.1)
Sodium: 138 mEq/L (ref 135–145)
Total Bilirubin: 0.6 mg/dL (ref 0.2–1.2)
Total Protein: 7 g/dL (ref 6.0–8.3)

## 2021-09-17 LAB — PSA: PSA: 0.45 ng/mL (ref 0.10–4.00)

## 2021-09-17 NOTE — Assessment & Plan Note (Signed)
Uses CPAP in truck and at home

## 2021-09-17 NOTE — Assessment & Plan Note (Signed)
Ongoing problems Discussed exercise and eating more healthy

## 2021-09-17 NOTE — Assessment & Plan Note (Signed)
Overdue for colon ---will check with Dr Havery Moros about this Discussed PSA ---will check UTD on all immunizations Needs to work a lot on lifestyle changes

## 2021-09-17 NOTE — Assessment & Plan Note (Signed)
Will check elbow x-ray Referral to neurology

## 2021-09-17 NOTE — Assessment & Plan Note (Signed)
Murmur not as evident today

## 2021-09-17 NOTE — Progress Notes (Addendum)
Subjective:    Patient ID: Marcus Williamson, male    DOB: 11-05-1950, 70 y.o.   MRN: [id number]  HPI Here for physical  Uses cane for balance---transfers with car and on uneven ground  Ongoing numbness on left side Worsens if he compresses elbow 4th (lateral) and 5th finger involved No problems above elbow or in neck No hand/arm weakness  Had 2 teeth pulled lower right  One more planned on upper there Sees Dr Tamala Julian in [place] driving [employer] issue is his knees---hard even getting in and out Not ready to retire yet  Current Outpatient Medications on File Prior to Visit  Medication Sig Dispense Refill   atenolol (TENORMIN) 50 MG tablet TAKE 2 TABLETS BY MOUTH EVERY DAY 180 tablet 3   diltiazem (CARDIZEM) 60 MG tablet TAKE 1 TABLET BY MOUTH THREE TIMES A DAY 270 tablet 3   doxazosin (CARDURA) 8 MG tablet TAKE 1 TABLET BY MOUTH EVERY DAY 90 tablet 3   lisinopril-hydrochlorothiazide (ZESTORETIC) 20-12.5 MG tablet TAKE 1 TABLET BY MOUTH TWICE A DAY 180 tablet 3   No current facility-administered medications on file prior to visit.    No Known Allergies  Past Medical History:  Diagnosis Date   Adenomatous polyp of colon    HTN (hypertension)    Obstructive sleep apnea    Thrombocytopenia ([place])     Past Surgical History:  Procedure Laterality Date   COLONOSCOPY WITH PROPOFOL N/A 11/15/2017   Procedure: COLONOSCOPY WITH PROPOFOL;  Surgeon: Yetta Flock, MD;  Location: [facility];  Service: Gastroenterology;  Laterality: N/A;   KNEE ARTHROSCOPY     MASTOIDECTOMY     bilateral--but mostly on left   TYMPANOSTOMY TUBE PLACEMENT      Family History  Problem Relation Age of Onset   Alzheimer's disease Mother    Heart disease Father    Cancer Father        unknown primary    Social History   Socioeconomic History   Marital status: Married    Spouse name: Not on file   Number of children: 1   Years of education: Not on file   Highest education  level: Not on file  Occupational History   Occupation: Long distance trucker  Tobacco Use   Smoking status: Some Days    Types: Cigars   Smokeless tobacco: Never   Tobacco comments:    occ cigar or cigarette--not daily  Substance and Sexual Activity   Alcohol use: Yes    Alcohol/week: 0.0 standard drinks   Drug use: No   Sexual activity: Not on file  Other Topics Concern   Not on file  Social History Narrative   No living will   Wife should make decisions for him   Would accept resuscitation   Not sure about tube feeds   Social Determinants of Health   Financial Resource Strain: Not on file  Food Insecurity: Not on file  Transportation Needs: Not on file  Physical Activity: Not on file  Stress: Not on file  Social Connections: Not on file  Intimate Partner Violence: Not on file   Review of Systems  Constitutional:  Negative for fatigue.       Gained another 10# No exercise Wears seat belt  HENT:  Positive for dental problem and tinnitus.        Still using hearing aides   Eyes:  Negative for visual disturbance.       No diplopia or unilateral vision loss  Respiratory:  Negative for cough, chest tightness and shortness of breath.   Cardiovascular:  Negative for chest pain and palpitations.       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       No heartburn  Endocrine: Negative for polydipsia and polyuria.  Genitourinary:  Negative for difficulty urinating and urgency.       Sex tough with joint pains  Musculoskeletal:  Positive for arthralgias and back pain.  Skin:  Negative for rash.  Allergic/Immunologic: Negative for environmental allergies and immunocompromised state.  Neurological:  Negative for dizziness, syncope, light-headedness and headaches.  Hematological:  Bruises/bleeds easily.       ?node swollen at angle of right jaw---?related to teeth problems   Psychiatric/Behavioral:  Negative for dysphoric mood and sleep disturbance. The patient is not nervous/anxious.        Still waiting for new CPAP machines----keeps one for truck and one for home      Objective:   Physical Exam Constitutional:      Appearance: He is obese.  HENT:     Head:     Comments: Small right parotid gland palpated---discussed ENT if doesn't go away over time    Right Ear: External ear normal.     Left Ear: External ear normal.     Mouth/Throat:     Pharynx: No oropharyngeal exudate or posterior oropharyngeal erythema.  Eyes:     Conjunctiva/sclera: Conjunctivae normal.     Pupils: Pupils are equal, round, and reactive to light.  Cardiovascular:     Rate and Rhythm: Normal rate and regular rhythm.     Pulses: Normal pulses.     Heart sounds: No murmur heard. Pulmonary:     Effort: Pulmonary effort is normal.     Breath sounds: Normal breath sounds. No wheezing or rales.  Abdominal:     Palpations: Abdomen is soft.     Tenderness: There is no abdominal tenderness.  Musculoskeletal:     Cervical back: Neck supple.     Comments: 1+ edema L>R Left elbow normal ROM and no tenderness  Lymphadenopathy:     Cervical: No cervical adenopathy.  Skin:    Findings: No lesion or rash.  Neurological:     General: No focal deficit present.     Mental Status: He is alert and oriented to person, place, and time.  Psychiatric:        Mood and Affect: Mood normal.        Behavior: Behavior normal.           Assessment & Plan:

## 2021-09-17 NOTE — Assessment & Plan Note (Signed)
BP Readings from Last 3 Encounters:  09/17/21 128/80  09/16/20 136/88  09/11/19 124/86   BP running fine on his multiple meds

## 2021-09-18 ENCOUNTER — Telehealth: Payer: Self-pay

## 2021-09-18 NOTE — Telephone Encounter (Signed)
Called and spoke with patient. He has been scheduled for a follow up with Dr. Havery Moros on Monday, 11/03/21 at 10:10 am. Pt verbalized understanding and had no concerns at the end of the call.

## 2021-09-18 NOTE — Telephone Encounter (Signed)
-----   Message from Yetta Flock, MD sent at 09/17/2021  5:16 PM EST ----- Herbert Seta can you contact the schedulers to book this patient a follow up with me in the office. His last exam was done at the hospital and he is overdue. Thanks  ----- Message ----- From: Venia Carbon, MD Sent: 09/17/2021  10:07 AM EST To: Yetta Flock, MD  Marcus Williamson, He was listed as a 3 year recall from 2/22 colon. He doesn't remember getting contacted. Can you look into this for me? Thanks, Denice Paradise

## 2021-10-31 ENCOUNTER — Other Ambulatory Visit: Payer: Self-pay | Admitting: Internal Medicine

## 2021-11-03 ENCOUNTER — Ambulatory Visit: Payer: 59 | Admitting: Gastroenterology

## 2021-11-03 ENCOUNTER — Encounter: Payer: Self-pay | Admitting: Gastroenterology

## 2021-11-03 VITALS — BP 148/84 | HR 74 | Ht 70.0 in | Wt 345.0 lb

## 2021-11-03 DIAGNOSIS — K5900 Constipation, unspecified: Secondary | ICD-10-CM | POA: Diagnosis not present

## 2021-11-03 DIAGNOSIS — Z86010 Personal history of colonic polyps: Secondary | ICD-10-CM

## 2021-11-03 MED ORDER — POLYETHYLENE GLYCOL 3350 17 G PO PACK: 17.0000 g | PACK | Freq: Every day | ORAL | 0 refills | Status: DC

## 2021-11-03 NOTE — Patient Instructions (Addendum)
If you are age 70 or older, your body mass index should be between 23-30. Your Body mass index is 49.5 kg/m. If this is out of the aforementioned range listed, please consider follow up with your Primary Care Provider.  If you are age 80 or younger, your body mass index should be between 19-25. Your Body mass index is 49.5 kg/m. If this is out of the aformentioned range listed, please consider follow up with your Primary Care Provider.   ________________________________________________________  The [facility] providers would like to encourage you to use [facility] to communicate with providers for non-urgent requests or questions.  Due to long hold times on the telephone, sending your provider a message by [facility] may be a faster and more efficient way to get a response.  Please allow 48 business hours for a response.  Please remember that this is for non-urgent requests.  _______________________________________________________  It has been recommended to you by your physician that you have a(n) colonoscopy completed. Per your request, we did not schedule the procedure(s) today as you indicated you need a Monday and we currently do not have a Monday in March. Please contact our office at [phone] in a couple of weeks when the April schedule should be open to pick a day that works for you.   Please purchase the following medications over the counter and take as directed:  Miralax : use as directed as needed Please use for several days prior to starting your colonoscopy prep to make sure you are not constipated.  Thank you for entrusting me with your care and for choosing [facility], Dr. Robinson Cellar

## 2021-11-03 NOTE — Progress Notes (Signed)
HPI :  71 year old male with a history of morbid obesity, OSA, tubular adenomas, here to reestablish care and discuss surveillance colonoscopy.  He had a colonoscopy with me in February 2019, was noted to have 6 polyps removed, the majority were considered adenomatous.  He denies any problems with his bowels at baseline other than occasional constipation.  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  No blood in his stools.  Uses occasional stool softener over-the-counter.  He has been dieting recently and has lost some weight.  The last time we performed his exam his BMI was over 50.  His body mass index is currently 49.5.  He denies any cardiopulmonary symptoms.  No family history of colon cancer.  Has OSA and uses CPAP which works well for him.  Otherwise feels well without any complaints today, wishes to pursue surveillance colonoscopy.  Colonoscopy 11/15/2017 - Perianal skin tag found on perianal exam. - Two 4 to 5 mm polyps in the ascending colon, removed with a cold snare. Resected and retrieved. - Two 4 to 5 mm polyps in the transverse colon, removed with a cold snare. Resected and retrieved. - Two 4 to 5 mm polyps in the descending colon, removed with a cold snare. Resected and retrieved. - Internal hemorrhoids. - The examination was otherwise normal.  Colon, biopsy, right, 2 ascending, 2 transverse, 2 descending cold snare - TUBULAR ADENOMA (5 OF 8 FRAGMENTS) - BENIGN COLONIC MUCOSA (3 OF 8 FRAGMENTS) - NO HIGH GRADE DYSPLASIA OR MALIGNANCY IDENTIFIED   Past Medical History:  Diagnosis Date   Adenomatous polyp of colon    HTN (hypertension)    Obstructive sleep apnea    Thrombocytopenia (HCC)      Past Surgical History:  Procedure Laterality Date   COLONOSCOPY WITH PROPOFOL N/A 11/15/2017   Procedure: COLONOSCOPY WITH PROPOFOL;  Surgeon: Yetta Flock, MD;  Location: [facility];  Service:  Gastroenterology;  Laterality: N/A;   KNEE ARTHROSCOPY     MASTOIDECTOMY     bilateral--but mostly on left   TYMPANOSTOMY TUBE PLACEMENT     Family History  Problem Relation Age of Onset   Alzheimer's disease Mother    Heart disease Father    Cancer Father        unknown primary   Social History   Tobacco Use   Smoking status: Some Days    Types: Cigars   Smokeless tobacco: Never   Tobacco comments:    occ cigar or cigarette--not daily  Substance Use Topics   Alcohol use: Yes    Alcohol/week: 0.0 standard drinks   Drug use: No   Current Outpatient Medications  Medication Sig Dispense Refill   polyethylene glycol (MIRALAX) 17 g packet Take 17 g by mouth daily. 14 each 0   atenolol (TENORMIN) 50 MG tablet TAKE 2 TABLETS BY MOUTH EVERY DAY 180 tablet 3   diltiazem (CARDIZEM) 60 MG tablet TAKE 1 TABLET BY MOUTH THREE TIMES A DAY 270 tablet 3   doxazosin (CARDURA) 8 MG tablet TAKE 1 TABLET BY MOUTH EVERY DAY 90 tablet 3   lisinopril-hydrochlorothiazide (ZESTORETIC) 20-12.5 MG tablet TAKE 1 TABLET BY MOUTH TWICE A DAY 180 tablet 3   No current facility-administered medications for this visit.   No Known Allergies   Review of Systems: All systems reviewed and negative except where noted in HPI.    Lab Results  Component Value Date   WBC  5.4 09/17/2021   HGB 13.6 09/17/2021   HCT 40.9 09/17/2021   MCV 94.3 09/17/2021   PLT 135.0 (L) 09/17/2021    Lab Results  Component Value Date   ALT 35 09/17/2021   AST 20 09/17/2021   ALKPHOS 50 09/17/2021   BILITOT 0.6 09/17/2021    Lab Results  Component Value Date   CREATININE 1.02 09/17/2021   BUN 23 09/17/2021   NA 138 09/17/2021   K 4.2 09/17/2021   CL 103 09/17/2021   CO2 28 09/17/2021     Physical Exam: BP (!) 148/84    Pulse 74    Ht 5\' 10"  (1.778 m)    Wt (!) 345 lb (156.5 kg)    BMI 49.50 kg/m  Constitutional: Pleasant,well-developed, male in no acute distress. HEENT: Normocephalic and atraumatic.  Conjunctivae are normal. No scleral icterus. Neck supple.  Cardiovascular: Normal rate, regular rhythm.  Pulmonary/chest: Effort normal and breath sounds normal.  Abdominal: Soft, nondistended, nontender - protuberant.  There are no masses palpable. Extremities: no edema Neurological: Alert and oriented to person place and time. Skin: Skin is warm and dry. No rashes noted. Psychiatric: Normal mood and affect. Behavior is normal.   ASSESSMENT AND PLAN: 71 year old male here to reestablish care for the following:  History of colon polyps Constipation  Patient is due for surveillance colonoscopy at this time.  We discussed colonoscopy and anesthesia, risks and benefits of each, and he wants to proceed.  He has lost some weight since have last seen him and his body mass index is now less than 50, as such he is cleared to have elective colonoscopy at the [facility].  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  Recommend MiraLAX once daily and can titrate up as needed.  This should not cause urgent bowel movements at low doses, should try at home first and titrate up as he tolerates.  He agrees with the plan, further recommendations pending the results and his course.  Plan: - schedule colonoscopy at the [facility] - Miralax PRN, and use at least daily for a few days prior to his prep  Jolly Mango, MD [facility]

## 2021-11-17 ENCOUNTER — Telehealth: Payer: Self-pay

## 2021-11-17 NOTE — Telephone Encounter (Signed)
Called and left detailed message for patient (and sent MyChart message) that the April schedule is out and Dr. Havery Moros has several Mondays available so he can call back and schedule his colonoscopy. Once scheduled I can send patient instructions. Note: If you schedule this patient, please let me know. Thank you

## 2021-11-17 NOTE — Telephone Encounter (Signed)
-----   Message from Roetta Sessions, [place] sent at 11/03/2021 11:18 AM EST ----- Regarding: needs a colon on Monday Patient needs a colon on a Monday - check the April schedule and call him or see if he called in to schedule

## 2021-11-18 NOTE — Telephone Encounter (Signed)
MyChart message sent to patient asking which prep he would like, pills or liquid.

## 2021-11-18 NOTE — Telephone Encounter (Signed)
Good morning... I have scheduled this pt for 01/19/22 at 8:30am. I did advise the pt that you will be reaching out to him for his instructions. Thank you.

## 2021-11-20 ENCOUNTER — Other Ambulatory Visit: Payer: Self-pay

## 2021-11-20 DIAGNOSIS — Z86010 Personal history of colonic polyps: Secondary | ICD-10-CM

## 2021-11-20 DIAGNOSIS — K5900 Constipation, unspecified: Secondary | ICD-10-CM

## 2021-11-20 MED ORDER — SUTAB 1479-225-188 MG PO TABS: 1.0000 | ORAL_TABLET | ORAL | 0 refills | Status: DC

## 2021-11-20 NOTE — Telephone Encounter (Signed)
Patient requested Sutab. Instructions sent to patient via MyChart and mail

## 2021-11-20 NOTE — Progress Notes (Signed)
Patient scheduled for coloscopy in the [facility] on 4-24. Instructions mailed and sent to [place]. Sutab sent to pharmacy.

## 2021-12-01 ENCOUNTER — Other Ambulatory Visit: Payer: Self-pay

## 2021-12-01 ENCOUNTER — Ambulatory Visit: Payer: 59 | Admitting: Neurology

## 2021-12-01 ENCOUNTER — Encounter: Payer: Self-pay | Admitting: Neurology

## 2021-12-01 VITALS — BP 132/90 | HR 63 | Ht 70.0 in | Wt 344.0 lb

## 2021-12-01 DIAGNOSIS — G5622 Lesion of ulnar nerve, left upper limb: Secondary | ICD-10-CM | POA: Diagnosis not present

## 2021-12-01 NOTE — Progress Notes (Signed)
?[facility] ?Neurology Division ?Clinic Note - Initial Visit ? ? ?Date: 12/01/21 ? ?Trina Ao ?MRN: [id number] ?DOB: Jan 14, 1951 ? ? ?Dear Dr. Silvio Pate: ? ?Thank you for your kind referral of Marcus Williamson for consultation of left hand numbness/tingling. Although his history is well known to you, please allow [place] to reiterate it for the purpose of our medical record. The patient was accompanied to the clinic by self. ?  ? ?History of Present Illness: ?Marcus Williamson is a 71 y.o. right-handed male with hypertension, OSA, and obesity presenting for evaluation of left hand numbness/tingling.  Starting around 2021, he began having numbness/tingling involving the 4th and 5th. Symptoms have been constant since onset.   He has some weakness of the hand, such as with opening bottles.   He is a long distance Administrator.  He admits to resting his elbow on the door.  He has tried different position of the arm and put foam padding, but he has not helped.  No neck pain or shooting.   ? ?Out-side paper records, electronic medical record, and images have been reviewed where available and summarized as:  ?Lab Results  ?Component Value Date  ? HGBA1C 5.8 09/16/2020  ? ?No results found for: VITAMINB12 ?Lab Results  ?Component Value Date  ? TSH 1.47 09/27/2013  ? ?No results found for: ESRSEDRATE, POCTSEDRATE ? ?Past Medical History:  ?Diagnosis Date  ? Adenomatous polyp of colon   ? HTN (hypertension)   ? Obstructive sleep apnea   ? Thrombocytopenia ([place])   ? ? ?Past Surgical History:  ?Procedure Laterality Date  ? COLONOSCOPY WITH PROPOFOL N/A 11/15/2017  ? Procedure: COLONOSCOPY WITH PROPOFOL;  Surgeon: Yetta Flock, MD;  Location: [facility];  Service: Gastroenterology;  Laterality: N/A;  ? KNEE ARTHROSCOPY    ? MASTOIDECTOMY    ? bilateral--but mostly on left  ? TYMPANOSTOMY TUBE PLACEMENT    ? ? ? ?Medications:  ?Outpatient Encounter Medications as of 12/01/2021  ?Medication Sig  ? atenolol (TENORMIN) 50 MG  tablet TAKE 2 TABLETS BY MOUTH EVERY DAY  ? diltiazem (CARDIZEM) 60 MG tablet TAKE 1 TABLET BY MOUTH THREE TIMES A DAY  ? doxazosin (CARDURA) 8 MG tablet TAKE 1 TABLET BY MOUTH EVERY DAY  ? lisinopril-hydrochlorothiazide (ZESTORETIC) 20-12.5 MG tablet TAKE 1 TABLET BY MOUTH TWICE A DAY  ? polyethylene glycol (MIRALAX) 17 g packet Take 17 g by mouth daily.  ? Sodium Sulfate-Mag Sulfate-KCl (SUTAB) [phone] MG TABS Take 1 kit by mouth as directed. BIN: [id number] PCN: CN GROUP: [id number] MEMBER ID: [id number]; RUN AS CASH (Patient not taking: Reported on 12/01/2021)  ? ?No facility-administered encounter medications on file as of 12/01/2021.  ? ? ?Allergies: No Known Allergies ? ?Family History: ?Family History  ?Problem Relation Age of Onset  ? Alzheimer's disease Mother   ? Heart disease Father   ? Cancer Father   ?     unknown primary  ? ? ?Social History: ?Social History  ? ?Tobacco Use  ? Smoking status: Some Days  ?  Types: Cigars  ? Smokeless tobacco: Never  ? Tobacco comments:  ?  occ cigar or cigarette--not daily  ?Substance Use Topics  ? Alcohol use: Yes  ?  Comment: Occasional Drink  ? Drug use: No  ? ?Social History  ? ?Social History Narrative  ? No living will  ? Wife should make decisions for him  ? Would accept resuscitation  ? Not sure about tube feeds  ? Right Handed   ?  Lives in a one level home with a basement  ?   ? Wife is a Astronomer   ? Patient is a truck drivert  ? ? ?Vital Signs:  ?BP 132/90   Pulse 63   Ht 5' 10"  (1.778 m)   Wt (!) 344 lb (156 kg)   SpO2 94%   BMI 49.36 kg/m?  ? ?Neurological Exam: ?MENTAL STATUS including orientation to time, place, person, recent and remote memory, attention span and concentration, language, and fund of knowledge is normal.  Speech is not dysarthric. ? ?CRANIAL NERVES: ?II:  No visual field defects.     ?III-IV-VI: Pupils equal round and reactive to light.  Normal conjugate, extra-ocular eye movements in all directions of gaze.  No nystagmus.  No  ptosis.   ?V:  Normal facial sensation.    ?VII:  Normal facial symmetry and movements.   ?VIII:  Normal hearing and vestibular function.   ?IX-X:  Normal palatal movement.   ?XI:  Normal shoulder shrug and head rotation.   ?XII:  Normal tongue strength and range of motion, no deviation or fasciculation. ? ?MOTOR:  Moderate atrophy of the left FDI and ADM.  No fasciculations or abnormal movements.  No pronator drift.  ? ?Upper Extremity:  Right  Left  ?Deltoid  5/5   5/5   ?Biceps  5/5   5/5   ?Triceps  5/5   5/5   ?Wrist extensors  5/5   5/5   ?Wrist flexors  5/5   5/5   ?Finger extensors  5/5   5/5   ?Finger flexors  5/5   5/5   ?Dorsal interossei  5/5   4/5   ?Abductor pollicis  5/5   5/5   ?Tone (Ashworth scale)  0  0  ? ?Lower Extremity:  Right  Left  ?Hip flexors  5/5   5/5   ?Knee flexors  5/5   5/5   ?Knee extensors  5/5   5/5   ?Dorsiflexors  5/5   5/5   ?Plantarflexors  5/5   5/5   ?Toe extensors  5/5   5/5   ?Toe flexors  5/5   5/5   ?Tone (Ashworth scale)  0  0  ? ?MSRs:  ?Right        Left                  ?brachioradialis 1+  1+  ?biceps 1+  1+  ?triceps 1+  1+  ?patellar 1+  1+  ?ankle jerk 1+  1+  ?Hoffman no  no  ?plantar response down  down  ? ?SENSORY:  Diminished sensation to pin prick and temperature over the left ulnar sensory distribution and patchy loss over the left foot.   ? ?COORDINATION/GAIT: Normal finger-to- nose-finger.  Intact rapid alternating movements bilaterally.  Gait is wide-based, assisted with cane, stable.  ? ? ?IMPRESSION: ?Left ulnar neuropathy across the elbow, suspect findings are at least moderate based on the presence of constant numbness/tingling and hand weakness/atrophy ?- NCS/EMG of the left arm to determine severity and localize symptoms ?- Refer to hand orthopaedics, based on these results ? ? ?Thank you for allowing me to participate in patient's care.  If I can answer any additional questions, I would be pleased to do so.   ? ?Sincerely, ? ? ? ?Maikayla Beggs K. Posey Pronto,  DO ? ?

## 2021-12-01 NOTE — Patient Instructions (Signed)
Nerve testing of the left arm ? ?ELECTROMYOGRAM AND NERVE CONDUCTION STUDIES (EMG/NCS) INSTRUCTIONS ? ?How to Prepare ?The neurologist conducting the EMG will need to know if you have certain medical conditions. Tell the neurologist and other EMG lab personnel if you: ?Have a pacemaker or any other electrical medical device ?Take blood-thinning medications ?Have hemophilia, a blood-clotting disorder that causes prolonged bleeding ?Bathing ?Take a shower or bath shortly before your exam in order to remove oils from your skin. Don?t apply lotions or creams before the exam.  ?What to Expect ?You?ll likely be asked to change into a hospital gown for the procedure and lie down on an examination table. The following explanations can help you understand what will happen during the exam.  ?Electrodes. The neurologist or a technician places surface electrodes at various locations on your skin depending on where you?re experiencing symptoms. Or the neurologist may insert needle electrodes at different sites depending on your symptoms.  ?Sensations. The electrodes will at times transmit a tiny electrical current that you may feel as a twinge or spasm. The needle electrode may cause discomfort or pain that usually ends shortly after the needle is removed. ?If you are concerned about discomfort or pain, you may want to talk to the neurologist about taking a short break during the exam.  ?Instructions. During the needle EMG, the neurologist will assess whether there is any spontaneous electrical activity when the muscle is at rest - activity that isn?t present in healthy muscle tissue - and the degree of activity when you slightly contract the muscle.  ?He or she will give you instructions on resting and contracting a muscle at appropriate times. Depending on what muscles and nerves the neurologist is examining, he or she may ask you to change positions during the exam.  ?After your EMG ?You may experience some temporary, minor  bruising where the needle electrode was inserted into your muscle. This bruising should fade within several days. If it persists, contact your primary care doctor.  ? ?

## 2021-12-05 ENCOUNTER — Other Ambulatory Visit: Payer: Self-pay | Admitting: Internal Medicine

## 2021-12-23 ENCOUNTER — Other Ambulatory Visit: Payer: Self-pay | Admitting: Internal Medicine

## 2021-12-30 ENCOUNTER — Telehealth: Payer: Self-pay | Admitting: Neurology

## 2021-12-30 ENCOUNTER — Ambulatory Visit: Payer: 59 | Admitting: Neurology

## 2021-12-30 DIAGNOSIS — G5622 Lesion of ulnar nerve, left upper limb: Secondary | ICD-10-CM

## 2021-12-30 DIAGNOSIS — G5602 Carpal tunnel syndrome, left upper limb: Secondary | ICD-10-CM

## 2021-12-30 NOTE — Telephone Encounter (Signed)
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  We will refer him to orthopaedics for consideration of surgical decompression. ?

## 2021-12-30 NOTE — Addendum Note (Signed)
Addended by: Armen Pickup A on: 12/30/2021 01:52 PM ? ? Modules accepted: Orders ? ?

## 2021-12-30 NOTE — Procedures (Signed)
[facility]  ?[address] ? [place], [place] [address] ?Tel: [phone] ?Fax:  [phone] ?Test Date:  12/30/2021 ? ?Patient: Marcus Williamson DOB: 1951-05-15 Physician: Narda Amber, DO  ?Sex: Male Height: '5\' 10"'$  Ref Phys: Narda Amber, DO  ?ID#: [id number]   Technician:   ? ?Patient Complaints: ?This is a 71 year old man referred for urination and numbness and tingling over the fourth and fifth fingers and hand weakness. ? ?NCV & EMG Findings: ?Extensive electrodiagnostic testing of the left upper extremity shows:  ?Left median sensory response shows prolonged latency (4.3 ms) and reduced amplitude (8.6 ?V).  Left ulnar sensory response is absent.  Left radial sensory response is within normal limits. ?Left median motor response shows prolonged latency (4.5 ms) and reduced amplitude (3.6 mV).  Left ulnar motor response shows prolonged latency (L3.4, L4.7 ms), and reduced amplitude (L1.0, L3.1 mV).   ?Chronic motor axonal loss changes are seen affecting the ulnar innervated muscles and the abductor pollicis brevis muscle.  There is no evidence of accompanying active denervation.   ? 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  Overall, these findings are severe in degree electrically. ?Left median neuropathy at or distal to the wrist (severe), consistent with a clinical diagnosis of carpal tunnel syndrome.   ? ? ?___________________________ ?Narda Amber, DO ? ? ? ?Nerve Conduction Studies ?Anti Sensory Summary Table ? ? Stim Site NR Peak (ms) Norm Peak (ms) P-T Amp (?V) Norm P-T Amp  ?Left Median Anti Sensory (2nd Digit)  34?C  ?Wrist    4.3 <3.8 8.6 >10  ?Left Radial Anti Sensory (Base 1st Digit)  34?C  ?Wrist    2.5 <2.8 12.2 >10  ?Left Ulnar Anti Sensory (5th Digit)  34?C  ?Wrist NR  <3.2  >5  ? ?Motor Summary Table ? ? Stim Site NR Onset (ms) Norm Onset (ms) O-P Amp (mV) Norm O-P Amp Site1  Site2 Delta-0 (ms) Dist (cm) Vel (m/s) Norm Vel (m/s)  ?Left Median Motor (Abd Poll Brev)  34?C  ?Wrist    4.5 <4.0 3.6 >5 Elbow Wrist 5.8 30.0 52 >50  ?Elbow    10.3  2.7         ?Left Ulnar Motor (Abd Dig Minimi)  34?C  ?Wrist    3.4 <3.1 1.0 >7 B Elbow Wrist 4.0 22.0 55 >50  ?B Elbow    7.4  0.6  A Elbow B Elbow  0.0  >50  ?A Elbow NR            ?Left Ulnar (FDI) Motor (1st DI)  34?C  ?Wrist    4.7 <4.5 3.1 >7 B Elbow Wrist 4.0 22.0 55 >50  ?B Elbow    8.7  2.6  A Elbow B Elbow  0.0  >50  ?A Elbow NR            ? ?EMG ? ? Side Muscle Ins Act Fibs Psw Fasc Number Recrt Dur Dur. Amp Amp. Poly Poly. Comment  ?Left 1stDorInt Nml Nml Nml Nml SMU Rapid All 1+ All 1+ All 1+ ATR  ?Left Abd Poll Brev Nml Nml Nml Nml 1- Rapid Some 1+ Some 1+ Some 1+ N/A  ?Left Ext Indicis Nml Nml Nml Nml Nml Nml Nml Nml Nml Nml Nml Nml N/A  ?Left PronatorTeres Nml Nml Nml Nml Nml Nml Nml Nml Nml Nml Nml Nml N/A  ?Left Biceps Nml Nml Nml  Nml Nml Nml Nml Nml Nml Nml Nml Nml N/A  ?Left Triceps Nml Nml Nml Nml Nml Nml Nml Nml Nml Nml Nml Nml N/A  ?Left ABD Dig Min Nml Nml Nml Nml 3- Rapid Most 1+ Most 1+ Most 1+ ATR  ?Left FlexCarpiUln Nml Nml Nml Nml 1- Rapid Few 1+ Few 1+ Few 1+ N/A  ? ? ? ? ?Waveforms: ?    ? ?    ? ? ?

## 2021-12-30 NOTE — Telephone Encounter (Signed)
Referral created and faxed to [facility].  ?

## 2022-01-11 ENCOUNTER — Encounter: Payer: Self-pay | Admitting: Certified Registered Nurse Anesthetist

## 2022-01-12 ENCOUNTER — Encounter: Payer: Self-pay | Admitting: Gastroenterology

## 2022-01-19 ENCOUNTER — Encounter: Payer: Self-pay | Admitting: Gastroenterology

## 2022-01-19 ENCOUNTER — Ambulatory Visit (AMBULATORY_SURGERY_CENTER): Payer: 59 | Admitting: Gastroenterology

## 2022-01-19 VITALS — BP 103/75 | HR 44 | Temp 98.7°F | Resp 20 | Ht 71.0 in | Wt 345.0 lb

## 2022-01-19 DIAGNOSIS — K635 Polyp of colon: Secondary | ICD-10-CM | POA: Diagnosis not present

## 2022-01-19 DIAGNOSIS — D122 Benign neoplasm of ascending colon: Secondary | ICD-10-CM

## 2022-01-19 DIAGNOSIS — D125 Benign neoplasm of sigmoid colon: Secondary | ICD-10-CM

## 2022-01-19 DIAGNOSIS — D123 Benign neoplasm of transverse colon: Secondary | ICD-10-CM

## 2022-01-19 DIAGNOSIS — D127 Benign neoplasm of rectosigmoid junction: Secondary | ICD-10-CM

## 2022-01-19 DIAGNOSIS — Z860100 Personal history of colon polyps, unspecified: Secondary | ICD-10-CM

## 2022-01-19 MED ORDER — SODIUM CHLORIDE 0.9 % IV SOLN: 500.0000 mL | Freq: Once | INTRAVENOUS | Status: DC

## 2022-01-19 NOTE — Progress Notes (Signed)
Called to room to assist during endoscopic procedure.  Patient ID and intended procedure confirmed with present staff. Received instructions for my participation in the procedure from the performing physician.  

## 2022-01-19 NOTE — Progress Notes (Signed)
[facility] History and Physical ? ? ?Primary Care Physician:  Venia Carbon, MD ? ? ?Reason for Procedure:   History of colon polyps ? ?Plan:    colonoscopy ? ? ? ? ?HPI: Marcus Williamson is a 71 y.o. male  here for colonoscopy surveillance - 5 adenomas removed 10/2017. Patient denies any bowel symptoms at this time. No family history of colon cancer known. Otherwise feels well without any cardiopulmonary symptoms. No changes since I have last seen him in the office. ? ? ?Past Medical History:  ?Diagnosis Date  ? Adenomatous polyp of colon   ? HTN (hypertension)   ? Obstructive sleep apnea   ? Sleep apnea   ? Thrombocytopenia ([place])   ? ? ?Past Surgical History:  ?Procedure Laterality Date  ? COLONOSCOPY WITH PROPOFOL N/A 11/15/2017  ? Procedure: COLONOSCOPY WITH PROPOFOL;  Surgeon: Yetta Flock, MD;  Location: [facility];  Service: Gastroenterology;  Laterality: N/A;  ? KNEE ARTHROSCOPY    ? MASTOIDECTOMY    ? bilateral--but mostly on left  ? TYMPANOSTOMY TUBE PLACEMENT    ? ? ?Prior to Admission medications   ?Medication Sig Start Date End Date Taking? Authorizing Provider  ?atenolol (TENORMIN) 50 MG tablet TAKE 2 TABLETS BY MOUTH EVERY DAY 10/31/21  Yes Venia Carbon, MD  ?diltiazem (CARDIZEM) 60 MG tablet TAKE 1 TABLET BY MOUTH THREE TIMES A DAY 12/24/21  Yes Viviana Simpler I, MD  ?doxazosin (CARDURA) 8 MG tablet TAKE 1 TABLET BY MOUTH EVERY DAY 10/31/21  Yes Venia Carbon, MD  ?lisinopril-hydrochlorothiazide (ZESTORETIC) 20-12.5 MG tablet TAKE 1 TABLET BY MOUTH TWICE A DAY 12/05/21  Yes Venia Carbon, MD  ?polyethylene glycol (MIRALAX) 17 g packet Take 17 g by mouth daily. 11/03/21  Yes Demetreus Lothamer, Carlota Raspberry, MD  ? ? ?Current Outpatient Medications  ?Medication Sig Dispense Refill  ? atenolol (TENORMIN) 50 MG tablet TAKE 2 TABLETS BY MOUTH EVERY DAY 180 tablet 3  ? diltiazem (CARDIZEM) 60 MG tablet TAKE 1 TABLET BY MOUTH THREE TIMES A DAY 270 tablet 3  ? doxazosin (CARDURA) 8 MG  tablet TAKE 1 TABLET BY MOUTH EVERY DAY 90 tablet 3  ? lisinopril-hydrochlorothiazide (ZESTORETIC) 20-12.5 MG tablet TAKE 1 TABLET BY MOUTH TWICE A DAY 180 tablet 3  ? polyethylene glycol (MIRALAX) 17 g packet Take 17 g by mouth daily. 14 each 0  ? ?Current Facility-Administered Medications  ?Medication Dose Route Frequency Provider Last Rate Last Admin  ? 0.9 %  sodium chloride infusion  500 mL Intravenous Once Roselina Burgueno, Carlota Raspberry, MD      ? ? ?Allergies as of 01/19/2022  ? (No Known Allergies)  ? ? ?Family History  ?Problem Relation Age of Onset  ? Alzheimer's disease Mother   ? Heart disease Father   ? Cancer Father   ?     unknown primary  ? Colon cancer Neg Hx   ? Rectal cancer Neg Hx   ? Stomach cancer Neg Hx   ? ? ?Social History  ? ?Socioeconomic History  ? Marital status: Married  ?  Spouse name: Patrik Turnbaugh  ? Number of children: 1  ? Years of education: Not on file  ? Highest education level: Not on file  ?Occupational History  ? Occupation: [employer] secretary  ?Tobacco Use  ? Smoking status: Some Days  ?  Types: Cigars  ? Smokeless tobacco: Never  ? Tobacco comments:  ?  occ cigar or cigarette--not daily  ?Substance and Sexual Activity  ?  Alcohol use: Yes  ?  Comment: Occasional Drink  ? Drug use: No  ? Sexual activity: Not on file  ?Other Topics Concern  ? Not on file  ?Social History Narrative  ? No living will  ? Wife should make decisions for him  ? Would accept resuscitation  ? Not sure about tube feeds  ? Right Handed   ? Lives in a one level home with a basement  ?   ? Wife is a Astronomer   ? Patient is a truck drivert  ? ?Social Determinants of Health  ? ?Financial Resource Strain: Not on file  ?Food Insecurity: Not on file  ?Transportation Needs: Not on file  ?Physical Activity: Not on file  ?Stress: Not on file  ?Social Connections: Not on file  ?Intimate Partner Violence: Not on file  ? ? ?Review of Systems: ?All other review of systems negative except as mentioned in the  HPI. ? ?Physical Exam: ?Vital signs ?BP 137/75   Pulse (!) 51   Temp 98.7 ?F (37.1 ?C) (Temporal)   Ht '5\' 11"'$  (1.803 m)   Wt (!) 345 lb (156.5 kg)   SpO2 95%   BMI 48.12 kg/m?  ? ?General:   Alert,  Well-developed, pleasant and cooperative in NAD ?Lungs:  Clear throughout to auscultation.   ?Heart:  Regular rate and rhythm ?Abdomen:  Soft, nontender and nondistended.   ?Neuro/Psych:  Alert and cooperative. Normal mood and affect. A and O x 3 ? ?Jolly Mango, MD ?[facility] ? ? ?

## 2022-01-19 NOTE — Patient Instructions (Signed)
Impression/Recommendations: ? ?Polyp and hemorrhoid handouts given to patient. ? ?Resume previous diet. ?Continue present medications. ?Await pathology results. ? ?YOU HAD AN ENDOSCOPIC PROCEDURE TODAY AT [facility]:   Refer to the procedure report that was given to you for any specific questions about what was found during the examination.  If the procedure report does not answer your questions, please call your gastroenterologist to clarify.  If you requested that your care partner not be given the details of your procedure findings, then the procedure report has been included in a sealed envelope for you to review at your convenience later. ? ?YOU SHOULD EXPECT: Some feelings of bloating in the abdomen. Passage of more gas than usual.  Walking can help get rid of the air that was put into your GI tract during the procedure and reduce the bloating. If you had a lower endoscopy (such as a colonoscopy or flexible sigmoidoscopy) you may notice spotting of blood in your stool or on the toilet paper. If you underwent a bowel prep for your procedure, you may not have a normal bowel movement for a few days. ? ?Please Note:  You might notice some irritation and congestion in your nose or some drainage.  This is from the oxygen used during your procedure.  There is no need for concern and it should clear up in a day or so. ? ?SYMPTOMS TO REPORT IMMEDIATELY: ? ?Following lower endoscopy (colonoscopy or flexible sigmoidoscopy): ? Excessive amounts of blood in the stool ? Significant tenderness or worsening of abdominal pains ? Swelling of the abdomen that is new, acute ? Fever of 100?F or higher ?For urgent or emergent issues, a gastroenterologist can be reached at any hour by calling [phone]. ?Do not use MyChart messaging for urgent concerns.  ? ? ?DIET:  We do recommend a small meal at first, but then you may proceed to your regular diet.  Drink plenty of fluids but you should avoid alcoholic  beverages for 24 hours. ? ?ACTIVITY:  You should plan to take it easy for the rest of today and you should NOT DRIVE or use heavy machinery until tomorrow (because of the sedation medicines used during the test).   ? ?FOLLOW UP: ?Our staff will call the number listed on your records 48-72 hours following your procedure to check on you and address any questions or concerns that you may have regarding the information given to you following your procedure. If we do not reach you, we will leave a message.  We will attempt to reach you two times.  During this call, we will ask if you have developed any symptoms of COVID 19. If you develop any symptoms (ie: fever, flu-like symptoms, shortness of breath, cough etc.) before then, please call [phone].  If you test positive for Covid 19 in the 2 weeks post procedure, please call and report this information to [place].   ? ?If any biopsies were taken you will be contacted by phone or by letter within the next 1-3 weeks.  Please call us at [phone] if you have not heard about the biopsies in 3 weeks.  ? ? ?SIGNATURES/CONFIDENTIALITY: ?You and/or your care partner have signed paperwork which will be entered into your electronic medical record.  These signatures attest to the fact that that the information above on your After Visit Summary has been reviewed and is understood.  Full responsibility of the confidentiality of this discharge information lies with you and/or your care-partner.  ?

## 2022-01-19 NOTE — Progress Notes (Signed)
Report given to PACU, vss 

## 2022-01-19 NOTE — Op Note (Signed)
[facility] ?Patient Name: Marcus Williamson ?Procedure Date: 01/19/2022 8:43 AM ?MRN: [id number] ?Endoscopist: Carlota Raspberry. Havery Moros , MD ?Age: 71 ?Referring MD:  ?Date of Birth: July 14, 1951 ?Gender: Male ?Account #: [id number] ?Procedure:                Colonoscopy ?Indications:              High risk colon cancer surveillance: Personal  ?                          history of colonic polyps - 5 adenomas removed  ?                          10/2017 ?Medicines:                Monitored Anesthesia Care ?Procedure:                Pre-Anesthesia Assessment: ?                          - Prior to the procedure, a History and Physical  ?                          was performed, and patient medications and  ?                          allergies were reviewed. The patient's tolerance of  ?                          previous anesthesia was also reviewed. The risks  ?                          and benefits of the procedure and the sedation  ?                          options and risks were discussed with the patient.  ?                          All questions were answered, and informed consent  ?                          was obtained. Prior Anticoagulants: The patient has  ?                          taken no previous anticoagulant or antiplatelet  ?                          agents. ASA Grade Assessment: III - A patient with  ?                          severe systemic disease. After reviewing the risks  ?                          and benefits, the patient was deemed in  ?  satisfactory condition to undergo the procedure. ?                          After obtaining informed consent, the colonoscope  ?                          was passed under direct vision. Throughout the  ?                          procedure, the patient's blood pressure, pulse, and  ?                          oxygen saturations were monitored continuously. The  ?                          CF [id number] #[id number] was introduced through the anus   ?                          and advanced to the the cecum, identified by  ?                          appendiceal orifice and ileocecal valve. The  ?                          colonoscopy was performed without difficulty. The  ?                          patient tolerated the procedure well. The quality  ?                          of the bowel preparation was good. The ileocecal  ?                          valve, appendiceal orifice, and rectum were  ?                          photographed. ?Scope In: 8:53:47 AM ?Scope Out: 9:18:45 AM ?Scope Withdrawal Time: 0 hours 22 minutes 15 seconds  ?Total Procedure Duration: 0 hours 24 minutes 58 seconds  ?Findings:                 Skin tags ./ hemorrhoids were found on perianal  ?                          exam. ?                          A 6 mm polyp was found in the ascending colon. The  ?                          polyp was sessile. The polyp was removed with a  ?                          cold snare. Resection and retrieval were complete. ?  A 3 to 4 mm polyp was found in the transverse  ?                          colon. The polyp was sessile. The polyp was removed  ?                          with a cold snare. Resection and retrieval were  ?                          complete. ?                          A 4 to 5 mm polyp was found in the recto-sigmoid  ?                          colon. The polyp was sessile. The polyp was removed  ?                          with a cold snare. Resection and retrieval were  ?                          complete. ?                          Internal hemorrhoids were found during  ?                          retroflexion. The hemorrhoids were moderate. ?                          The exam was otherwise without abnormality. ?Complications:            No immediate complications. Estimated blood loss:  ?                          Minimal. ?Estimated Blood Loss:     Estimated blood loss was minimal. ?Impression:               -  Perianal skin tags / hemorrhoids found on  ?                          perianal exam. ?                          - One 6 mm polyp in the ascending colon, removed  ?                          with a cold snare. Resected and retrieved. ?                          - One 3 to 4 mm polyp in the transverse colon,  ?                          removed with a cold snare. Resected and retrieved. ?                          -  One 4 to 5 mm polyp at the recto-sigmoid colon,  ?                          removed with a cold snare. Resected and retrieved. ?                          - Internal hemorrhoids. ?                          - The examination was otherwise normal. ?Recommendation:           - Patient has a contact number available for  ?                          emergencies. The signs and symptoms of potential  ?                          delayed complications were discussed with the  ?                          patient. Return to normal activities tomorrow.  ?                          Written discharge instructions were provided to the  ?                          patient. ?                          - Resume previous diet. ?                          - Continue present medications. ?                          - Await pathology results. ?Carlota Raspberry. Havery Moros, MD ?01/19/2022 9:23:25 AM ?This report has been signed electronically. ?

## 2022-01-21 ENCOUNTER — Telehealth: Payer: Self-pay

## 2022-01-21 ENCOUNTER — Encounter: Payer: Self-pay | Admitting: Neurology

## 2022-01-21 ENCOUNTER — Encounter: Payer: Self-pay | Admitting: Internal Medicine

## 2022-01-21 NOTE — Telephone Encounter (Signed)
Follow up call placed, VM obtained and message left. ?SChaplin, RN,BSN ? ?

## 2022-01-21 NOTE — Telephone Encounter (Signed)
?  Follow up Call- ? ? ?  01/19/2022  ?  8:37 AM  ?Call back number  ?Post procedure Call Back phone  # [phone]  ?Permission to leave phone message Yes  ? First follow up call, LVM ?

## 2022-01-29 ENCOUNTER — Telehealth: Payer: Self-pay | Admitting: Neurology

## 2022-01-29 NOTE — Telephone Encounter (Addendum)
Marla from [facility] called and said the patient has been calling them to schedule an EMG as ordered by Dr. Narda Amber. ? ?To date, they've not received a referral. Please resend. ? ?fax [phone] ?Phone [phone] ?

## 2022-01-30 NOTE — Telephone Encounter (Signed)
Raliegh Ip called they do not open until 8:30 ?

## 2022-02-06 ENCOUNTER — Telehealth: Payer: Self-pay | Admitting: Neurology

## 2022-02-06 NOTE — Telephone Encounter (Signed)
EMG results have been faxed. Called patient and informed. ?

## 2022-02-06 NOTE — Telephone Encounter (Signed)
Patient called and stated he needs his results from his EMG sent over to the Dr he has referred to.  Dr Alfonso Ramus, fax # [phone]. ?

## 2022-09-23 ENCOUNTER — Encounter: Payer: Self-pay | Admitting: Internal Medicine

## 2022-09-23 ENCOUNTER — Ambulatory Visit (INDEPENDENT_AMBULATORY_CARE_PROVIDER_SITE_OTHER): Payer: 59 | Admitting: Internal Medicine

## 2022-09-23 VITALS — BP 126/84 | HR 44 | Temp 97.3°F | Ht 69.5 in | Wt 326.0 lb

## 2022-09-23 DIAGNOSIS — R7303 Prediabetes: Secondary | ICD-10-CM

## 2022-09-23 DIAGNOSIS — Z125 Encounter for screening for malignant neoplasm of prostate: Secondary | ICD-10-CM

## 2022-09-23 DIAGNOSIS — Z0000 Encounter for general adult medical examination without abnormal findings: Secondary | ICD-10-CM

## 2022-09-23 DIAGNOSIS — I10 Essential (primary) hypertension: Secondary | ICD-10-CM | POA: Diagnosis not present

## 2022-09-23 LAB — COMPREHENSIVE METABOLIC PANEL
ALT: 26 U/L (ref 0–53)
AST: 18 U/L (ref 0–37)
Albumin: 4.1 g/dL (ref 3.5–5.2)
Alkaline Phosphatase: 53 U/L (ref 39–117)
BUN: 22 mg/dL (ref 6–23)
CO2: 29 mEq/L (ref 19–32)
Calcium: 9.4 mg/dL (ref 8.4–10.5)
Chloride: 103 mEq/L (ref 96–112)
Creatinine, Ser: 1.17 mg/dL (ref 0.40–1.50)
GFR: 63.11 mL/min (ref >60.00)
Glucose, Bld: 105 mg/dL — ABNORMAL HIGH (ref 70–99)
Potassium: 4.8 mEq/L (ref 3.5–5.1)
Sodium: 138 mEq/L (ref 135–145)
Total Bilirubin: 0.5 mg/dL (ref 0.2–1.2)
Total Protein: 6.5 g/dL (ref 6.0–8.3)

## 2022-09-23 LAB — CBC
HCT: 39.2 % (ref 39.0–52.0)
Hemoglobin: 13.1 g/dL (ref 13.0–17.0)
MCHC: 33.5 g/dL (ref 30.0–36.0)
MCV: 90.9 fl (ref 78.0–100.0)
Platelets: 155 10*3/uL (ref 150.0–400.0)
RBC: 4.32 Mil/uL (ref 4.22–5.81)
RDW: 13.1 % (ref 11.5–15.5)
WBC: 4.5 10*3/uL (ref 4.0–10.5)

## 2022-09-23 LAB — LIPID PANEL
Cholesterol: 180 mg/dL (ref 0–200)
HDL: 46 mg/dL
LDL Cholesterol: 116 mg/dL — ABNORMAL HIGH (ref 0–99)
NonHDL: 134.2
Total CHOL/HDL Ratio: 4
Triglycerides: 92 mg/dL (ref 0.0–149.0)
VLDL: 18.4 mg/dL (ref 0.0–40.0)

## 2022-09-23 LAB — PSA: PSA: 0.53 ng/mL (ref 0.10–4.00)

## 2022-09-23 LAB — HEMOGLOBIN A1C: Hgb A1c MFr Bld: 6 % (ref 4.6–6.5)

## 2022-09-23 MED ORDER — DILTIAZEM HCL ER COATED BEADS 180 MG PO CP24: 180.0000 mg | ORAL_CAPSULE | Freq: Every day | ORAL | 3 refills | Status: DC

## 2022-09-23 NOTE — Assessment & Plan Note (Signed)
Healthy and improved on lifestyle Has lost 20# Discussed exercise Colon due again in 3 years Will check PSA UTD on all imms

## 2022-09-23 NOTE — Assessment & Plan Note (Addendum)
BP Readings from Last 3 Encounters:  09/23/22 126/84  01/19/22 103/75  12/01/21 132/90   Good control on atenolol 100 daily, diltiazem 60 tid, doxazosin '8mg'$ , lisinopril/HCTZ 20/12.5 Will try changing the dilt to daily

## 2022-09-23 NOTE — Assessment & Plan Note (Signed)
Has lost 20# Will recheck labs

## 2022-09-23 NOTE — Assessment & Plan Note (Signed)
Working on lifestyle and lost 20#

## 2022-09-23 NOTE — Progress Notes (Signed)
Subjective:    Patient ID: Marcus Williamson, male    DOB: May 12, 1951, 71 y.o.   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, etc--some of the time)  Did have the carpal tunnel repair and at the elbow--but still with lateral left hand Uses cane at times for balance--not all the time  Continues to work on diet Portion control [place] while on the road No regular exercise--other than at work  No new health concerns  Current Outpatient Medications on File Prior to Visit  Medication Sig Dispense Refill   atenolol (TENORMIN) 50 MG tablet TAKE 2 TABLETS BY MOUTH EVERY DAY 180 tablet 3   diltiazem (CARDIZEM) 60 MG tablet TAKE 1 TABLET BY MOUTH THREE TIMES A DAY 270 tablet 3   doxazosin (CARDURA) 8 MG tablet TAKE 1 TABLET BY MOUTH EVERY DAY 90 tablet 3   lisinopril-hydrochlorothiazide (ZESTORETIC) 20-12.5 MG tablet TAKE 1 TABLET BY MOUTH TWICE A DAY 180 tablet 3   No current facility-administered medications on file prior to visit.    No Known Allergies  Past Medical History:  Diagnosis Date   Adenomatous polyp of colon    HTN (hypertension)    Obstructive sleep apnea    Sleep apnea    Thrombocytopenia (HCC)     Past Surgical History:  Procedure Laterality Date   COLONOSCOPY WITH PROPOFOL N/A 11/15/2017   Procedure: COLONOSCOPY WITH PROPOFOL;  Surgeon: Yetta Flock, MD;  Location: [facility];  Service: Gastroenterology;  Laterality: N/A;   KNEE ARTHROSCOPY     MASTOIDECTOMY     bilateral--but mostly on left   TYMPANOSTOMY TUBE PLACEMENT      Family History  Problem Relation Age of Onset   Alzheimer's disease Mother    Heart disease Father    Cancer Father        unknown primary   Colon cancer Neg Hx    Rectal cancer Neg Hx    Stomach cancer Neg Hx     Social History   Socioeconomic History   Marital status: Married    Spouse name: Ewell Benassi   Number of  children: 1   Years of education: Not on file   Highest education level: Not on file  Occupational History   Occupation: Long distance trucker  Tobacco Use   Smoking status: Some Days    Types: Cigars    Passive exposure: Past   Smokeless tobacco: Never   Tobacco comments:    occ cigar or cigarette--not daily  Substance and Sexual Activity   Alcohol use: Yes    Comment: Occasional Drink   Drug use: No   Sexual activity: Not on file  Other Topics Concern   Not on file  Social History Narrative   No living will   Wife should make decisions for him   Would accept resuscitation   Not sure about tube feeds   Right Handed    Lives in a one level home with a basement      Wife is a Mudlogger of Nurse    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       Wears seat belt Has lost 20#--BMI down to 47  HENT:  Positive for hearing loss and tinnitus. Negative for dental problem.        Has hearing aides Keeps up with dentist  Eyes:  Negative for visual disturbance.       No diplopia or unilateral vision loss  Respiratory:  Negative for cough, chest tightness and shortness of breath.   Cardiovascular:  Negative for chest pain and palpitations.       Some leg swelling---with prolonged sitting. Better in AM Hard to get on compression socks--they will look at alternatives  Gastrointestinal:  Negative for blood in stool and constipation.       No heartburn  Endocrine: Negative for polydipsia and polyuria.  Genitourinary:  Negative for difficulty urinating and urgency.       No sexual problems  Musculoskeletal:  Positive for arthralgias and back pain. Negative for joint  swelling.       Uses tylenol/ibuprofen prn  Skin:  Negative for rash.       No suspicious lesions  Allergic/Immunologic: Positive for environmental allergies. Negative for immunocompromised state.       Rarely uses OTC fexofenadine  Neurological:  Negative for dizziness, syncope, light-headedness and headaches.  Hematological:  Negative for adenopathy. Bruises/bleeds easily.  Psychiatric/Behavioral:  Negative for dysphoric mood and sleep disturbance. The patient is not nervous/anxious.        Uses CPAP home and in truck       Objective:   Physical Exam Constitutional:      Appearance: Normal appearance.  HENT:     Mouth/Throat:     Pharynx: No oropharyngeal exudate or posterior oropharyngeal erythema.  Eyes:     Conjunctiva/sclera: Conjunctivae normal.     Pupils: Pupils are equal, round, and reactive to light.  Cardiovascular:     Rate and Rhythm: Normal rate and regular rhythm.     Pulses: Normal pulses.     Heart sounds: No murmur heard.    No gallop.  Pulmonary:     Effort: Pulmonary effort is normal.     Breath sounds: Normal breath sounds. No wheezing or rales.  Abdominal:     Palpations: Abdomen is soft.     Tenderness: There is no abdominal tenderness.  Musculoskeletal:     Cervical back: Neck supple.     Right lower leg: No edema.     Left lower leg: No edema.     Comments: Calves tight but no pitting  Lymphadenopathy:     Cervical: No cervical adenopathy.  Skin:    Findings: No lesion or rash.  Neurological:     General: No focal deficit present.     Mental Status: He is alert and oriented to person, place, and time.  Psychiatric:        Mood and Affect: Mood normal.        Behavior: Behavior normal.            Assessment & Plan:

## 2022-10-28 ENCOUNTER — Other Ambulatory Visit: Payer: Self-pay | Admitting: Internal Medicine

## 2022-12-10 ENCOUNTER — Other Ambulatory Visit: Payer: Self-pay | Admitting: Internal Medicine

## 2023-07-03 ENCOUNTER — Other Ambulatory Visit: Payer: Self-pay | Admitting: Internal Medicine

## 2023-09-28 ENCOUNTER — Encounter: Payer: 59 | Admitting: Internal Medicine

## 2023-10-03 ENCOUNTER — Telehealth: Payer: Self-pay

## 2023-10-03 ENCOUNTER — Other Ambulatory Visit: Payer: Self-pay | Admitting: Internal Medicine

## 2023-10-03 NOTE — Telephone Encounter (Signed)
 Called patient left message to reschedule appointment due to weather.

## 2023-10-04 ENCOUNTER — Encounter: Payer: 59 | Admitting: Internal Medicine

## 2023-10-11 ENCOUNTER — Encounter: Payer: Self-pay | Admitting: Internal Medicine

## 2023-10-11 ENCOUNTER — Ambulatory Visit (INDEPENDENT_AMBULATORY_CARE_PROVIDER_SITE_OTHER): Payer: 59 | Admitting: Internal Medicine

## 2023-10-11 VITALS — BP 138/102 | HR 50 | Temp 98.0°F | Ht 70.0 in | Wt 340.0 lb

## 2023-10-11 DIAGNOSIS — Z125 Encounter for screening for malignant neoplasm of prostate: Secondary | ICD-10-CM | POA: Diagnosis not present

## 2023-10-11 DIAGNOSIS — I10 Essential (primary) hypertension: Secondary | ICD-10-CM | POA: Diagnosis not present

## 2023-10-11 DIAGNOSIS — Z1159 Encounter for screening for other viral diseases: Secondary | ICD-10-CM | POA: Diagnosis not present

## 2023-10-11 DIAGNOSIS — G4733 Obstructive sleep apnea (adult) (pediatric): Secondary | ICD-10-CM | POA: Diagnosis not present

## 2023-10-11 DIAGNOSIS — R7303 Prediabetes: Secondary | ICD-10-CM | POA: Diagnosis not present

## 2023-10-11 DIAGNOSIS — Z0000 Encounter for general adult medical examination without abnormal findings: Secondary | ICD-10-CM | POA: Diagnosis not present

## 2023-10-11 DIAGNOSIS — I350 Nonrheumatic aortic (valve) stenosis: Secondary | ICD-10-CM

## 2023-10-11 LAB — CBC
HCT: 40.3 % (ref 39.0–52.0)
Hemoglobin: 13.7 g/dL (ref 13.0–17.0)
MCHC: 33.9 g/dL (ref 30.0–36.0)
MCV: 93.1 fL (ref 78.0–100.0)
Platelets: 136 10*3/uL — ABNORMAL LOW (ref 150.0–400.0)
RBC: 4.33 Mil/uL (ref 4.22–5.81)
RDW: 13.5 % (ref 11.5–15.5)
WBC: 6.4 10*3/uL (ref 4.0–10.5)

## 2023-10-11 LAB — HEMOGLOBIN A1C: Hgb A1c MFr Bld: 6.1 % (ref 4.6–6.5)

## 2023-10-11 LAB — LIPID PANEL
Cholesterol: 191 mg/dL (ref 0–200)
HDL: 50.2 mg/dL (ref >39.00)
LDL Cholesterol: 116 mg/dL — ABNORMAL HIGH (ref 0–99)
NonHDL: 141.12
Total CHOL/HDL Ratio: 4
Triglycerides: 126 mg/dL (ref 0.0–149.0)
VLDL: 25.2 mg/dL (ref 0.0–40.0)

## 2023-10-11 LAB — COMPREHENSIVE METABOLIC PANEL
ALT: 21 U/L (ref 0–53)
AST: 18 U/L (ref 0–37)
Albumin: 4.5 g/dL (ref 3.5–5.2)
Alkaline Phosphatase: 55 U/L (ref 39–117)
BUN: 29 mg/dL — ABNORMAL HIGH (ref 6–23)
CO2: 24 meq/L (ref 19–32)
Calcium: 9.4 mg/dL (ref 8.4–10.5)
Chloride: 103 meq/L (ref 96–112)
Creatinine, Ser: 1.21 mg/dL (ref 0.40–1.50)
GFR: 60.17 mL/min (ref >60.00)
Glucose, Bld: 107 mg/dL — ABNORMAL HIGH (ref 70–99)
Potassium: 4.5 meq/L (ref 3.5–5.1)
Sodium: 137 meq/L (ref 135–145)
Total Bilirubin: 0.6 mg/dL (ref 0.2–1.2)
Total Protein: 6.8 g/dL (ref 6.0–8.3)

## 2023-10-11 LAB — PSA: PSA: 0.82 ng/mL (ref 0.10–4.00)

## 2023-10-11 NOTE — Progress Notes (Addendum)
 Subjective:    Patient ID: Marcus Williamson, male    DOB: Aug 28, 1951, 73 y.o.   MRN: [id number]  HPI Here for physical  Still driving--expects another year or 2  Ongoing knee and back pain Uses ibuprofen and acetaminophen prn Not really exercising  Tries to be careful with eating Avoids fast food Did gain back some of the weight he lost--has been off for 3 weeks  Current Outpatient Medications on File Prior to Visit  Medication Sig Dispense Refill   atenolol  (TENORMIN ) 50 MG tablet TAKE 2 TABLETS BY MOUTH EVERY DAY 180 tablet 3   diltiazem  (CARDIZEM  CD) 180 MG 24 hr capsule TAKE 1 CAPSULE BY MOUTH EVERY DAY 90 capsule 2   doxazosin  (CARDURA ) 8 MG tablet TAKE 1 TABLET BY MOUTH EVERY DAY 90 tablet 3   lisinopril -hydrochlorothiazide  (ZESTORETIC ) 20-12.5 MG tablet TAKE 1 TABLET BY MOUTH TWICE A DAY 180 tablet 3   No current facility-administered medications on file prior to visit.    No Known Allergies  Past Medical History:  Diagnosis Date   Adenomatous polyp of colon    HTN (hypertension)    Obstructive sleep apnea    Sleep apnea    Thrombocytopenia (HCC)     Past Surgical History:  Procedure Laterality Date   COLONOSCOPY WITH PROPOFOL  N/A 11/15/2017   Procedure: COLONOSCOPY WITH PROPOFOL ;  Surgeon: Leigh Elspeth SQUIBB, MD;  Location: [facility];  Service: Gastroenterology;  Laterality: N/A;   KNEE ARTHROSCOPY     MASTOIDECTOMY     bilateral--but mostly on left   TYMPANOSTOMY TUBE PLACEMENT      Family History  Problem Relation Age of Onset   Alzheimer's disease Mother    Heart disease Father    Cancer Father        unknown primary   Colon cancer Neg Hx    Rectal cancer Neg Hx    Stomach cancer Neg Hx     Social History   Socioeconomic History   Marital status: Married    Spouse name: Lewis Keats   Number of children: 1   Years of education: Not on file   Highest education level: Not on file  Occupational History   Occupation: Long distance  trucker  Tobacco Use   Smoking status: Some Days    Types: Cigars    Passive exposure: Past   Smokeless tobacco: Never   Tobacco comments:    occ cigar or cigarette--not daily  Substance and Sexual Activity   Alcohol use: Yes    Comment: Occasional Drink   Drug use: No   Sexual activity: Not on file  Other Topics Concern   Not on file  Social History Narrative   No living will   Wife should make decisions for him   Would accept resuscitation   Not sure about tube feeds   Right Handed    Lives in a one level home with a basement      Wife is a Interior And Spatial Designer of Nurse    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       Wears seat belt  HENT:  Positive for hearing loss and tinnitus.  Hearing aides Keeps up with dentist  Eyes:  Negative for visual disturbance.       No diplopia or unilateral vision loss  Respiratory:  Negative for cough, chest tightness and shortness of breath.   Cardiovascular:  Positive for leg swelling. Negative for chest pain and palpitations.  Gastrointestinal:  Negative for blood in stool and constipation.       No heartburn  Endocrine: Negative for polydipsia and polyuria.  Genitourinary:  Negative for difficulty urinating and urgency.       No sexual problems  Musculoskeletal:  Positive for arthralgias and back pain. Negative for joint swelling.  Skin:  Negative for rash.  Allergic/Immunologic: Negative for immunocompromised state.       Mild symptoms--doesn't need meds  Neurological:  Negative for dizziness, syncope, light-headedness and headaches.  Hematological:  Negative for adenopathy. Bruises/bleeds easily.  Psychiatric/Behavioral:   Negative for dysphoric mood and sleep disturbance. The patient is not nervous/anxious.        Objective:   Physical Exam Constitutional:      Appearance: Normal appearance. He is obese.  HENT:     Mouth/Throat:     Pharynx: No oropharyngeal exudate or posterior oropharyngeal erythema.  Eyes:     Conjunctiva/sclera: Conjunctivae normal.     Pupils: Pupils are equal, round, and reactive to light.  Cardiovascular:     Rate and Rhythm: Normal rate and regular rhythm.     Pulses: Normal pulses.     Heart sounds:     No gallop.     Comments: Gr 3/6 aortic systolic murmur Pulmonary:     Effort: Pulmonary effort is normal.     Breath sounds: Normal breath sounds. No wheezing or rales.  Abdominal:     Palpations: Abdomen is soft.     Tenderness: There is no abdominal tenderness.  Musculoskeletal:     Cervical back: Neck supple.     Comments: 1-2+ non pitting calf/foot edema  Lymphadenopathy:     Cervical: No cervical adenopathy.  Skin:    Findings: No lesion or rash.  Neurological:     General: No focal deficit present.     Mental Status: He is alert and oriented to person, place, and time.  Psychiatric:        Mood and Affect: Mood normal.        Behavior: Behavior normal.            Assessment & Plan:

## 2023-10-11 NOTE — Assessment & Plan Note (Signed)
 Healthy but needs to work on fitness UTD on imms Colon due next year Will check PSA

## 2023-10-11 NOTE — Assessment & Plan Note (Signed)
 Murmur sounds like this--will check echo

## 2023-10-11 NOTE — Assessment & Plan Note (Signed)
 Needs to work on eating better again

## 2023-10-11 NOTE — Assessment & Plan Note (Signed)
Uses the CPAP nightly with good results 

## 2023-10-11 NOTE — Assessment & Plan Note (Addendum)
 BP Readings from Last 3 Encounters:  10/11/23 (!) 138/102  09/23/22 126/84  01/19/22 103/75   Up a bit today He will check at home--wife is nurse  CDL coming up On lisinopril /hydrochlorothiazide  40/25, diltiazem  180, doxazosin  8 Will increase diltiazem  if BP stays up

## 2023-10-12 LAB — HEPATITIS C ANTIBODY: Hepatitis C Ab: NONREACTIVE

## 2023-10-15 ENCOUNTER — Other Ambulatory Visit: Payer: Self-pay | Admitting: Internal Medicine

## 2023-12-01 ENCOUNTER — Other Ambulatory Visit: Payer: Self-pay | Admitting: Internal Medicine

## 2023-12-06 ENCOUNTER — Telehealth: Admitting: Physician Assistant

## 2023-12-06 ENCOUNTER — Ambulatory Visit: Payer: 59

## 2023-12-06 DIAGNOSIS — M25441 Effusion, right hand: Secondary | ICD-10-CM

## 2023-12-06 DIAGNOSIS — M259 Joint disorder, unspecified: Secondary | ICD-10-CM

## 2023-12-06 DIAGNOSIS — M25541 Pain in joints of right hand: Secondary | ICD-10-CM

## 2023-12-06 MED ORDER — PREDNISONE 20 MG PO TABS: 40.0000 mg | ORAL_TABLET | Freq: Every day | ORAL | 0 refills | Status: DC

## 2023-12-06 NOTE — Progress Notes (Signed)
 E-Visit for Gout Symptoms  We are sorry that you are not feeling well. We are here to help!  Based on what you shared with me it looks like you have a flare of your gout.  Gout is a form of arthritis. It can cause pain and swelling in the joints. At first, it tends to affect only 1 joint - most frequently the big toe. It happens in people who have too much uric acid in the blood. Uric acid is a chemical that is produced when the body breaks down certain foods. Uric acid can form sharp needle-like crystals that build up in the joints and cause pain. Uric acid crystals can also form inside the tubes that carry urine from the kidneys to the bladder. These crystals can turn into "kidney stones" that can cause pain and problems with the flow of urine. People with gout get sudden "flares" or attacks of severe pain, most often the big toe, ankle, or knee. Often the joint also turns red and swells. Usually, only 1 joint is affected, but some people have pain in more than 1 joint. Gout flares tend to happen more often during the night.  The pain from gout can be extreme. The pain and swelling are worst at the beginning of a gout flare. The symptoms then get better within a few days to weeks. It is not clear how the body "turns off" a gout flare.  Do not start any NEW preventative medicine until the gout has cleared completely. However, If you are already on Probenecid or Allopurinol for CHRONIC gout, you may continue taking this during an active flare up  I have prescribed Prednisone 40 mg daily for 5 days   HOME CARE Losing weight can help relieve gout. It's not clear that following a specific diet plan will help with gout symptoms but eating a balanced diet can help improve your overall health. It can also help you lose weight, if you are overweight. In general, a healthy diet includes plenty of fruits, vegetables, whole grains, and low-fat dairy products (labelled "low fat", skim, 2%). Avoid sugar  sweetened drinks (including sodas, tea, juice and juice blends, coffee drinks and sports drinks) Limit alcohol to 1-2 drinks of beer, spirits or wine daily these can make gout flares worse. Some people with gout also have other health problems, such as heart disease, high blood pressure, kidney disease, or obesity. If you have any of these issues, it's important to work with your doctor to manage them. This can help improve your overall health and might also help with your gout.  GET HELP RIGHT AWAY IF: Your symptoms persist after you have completed your treatment plan You develop severe diarrhea You develop abnormal sensations  You develop vomiting,   You develop weakness  You develop abdominal pain  FOLLOW UP WITH YOUR PRIMARY PROVIDER IF: If your symptoms do not improve within 10 days  MAKE SURE YOU  Understand these instructions. Will watch your condition. Will get help right away if you are not doing well or get worse.  Thank you for choosing an e-visit.  Your e-visit answers were reviewed by a board certified advanced clinical practitioner to complete your personal care plan. Depending upon the condition, your plan could have included both over the counter or prescription medications.  Please review your pharmacy choice. Make sure the pharmacy is open so you can pick up prescription now. If there is a problem, you may contact your provider through [employer] and  have the prescription routed to another pharmacy.  Your safety is important to [place]. If you have drug allergies check your prescription carefully.   For the next 24 hours you can use MyChart to ask questions about today's visit, request a non-urgent call back, or ask for a work or school excuse. You will get an email in the next two days asking about your experience. I hope that your e-visit has been valuable and will speed your recovery.   I have spent 5 minutes in review of e-visit questionnaire, review and updating  patient chart, medical decision making and response to patient.   Margaretann Loveless, PA-C

## 2024-10-09 ENCOUNTER — Other Ambulatory Visit: Payer: Self-pay

## 2024-10-09 MED ORDER — ATENOLOL 50 MG PO TABS: 100.0000 mg | ORAL_TABLET | Freq: Every day | ORAL | 0 refills | Status: DC

## 2024-10-16 ENCOUNTER — Ambulatory Visit

## 2024-10-16 DIAGNOSIS — N401 Enlarged prostate with lower urinary tract symptoms: Secondary | ICD-10-CM | POA: Insufficient documentation

## 2024-10-16 DIAGNOSIS — R351 Nocturia: Secondary | ICD-10-CM

## 2024-10-16 DIAGNOSIS — I10 Essential (primary) hypertension: Secondary | ICD-10-CM

## 2024-10-16 DIAGNOSIS — G4733 Obstructive sleep apnea (adult) (pediatric): Secondary | ICD-10-CM | POA: Diagnosis not present

## 2024-10-16 DIAGNOSIS — D696 Thrombocytopenia, unspecified: Secondary | ICD-10-CM | POA: Diagnosis not present

## 2024-10-16 DIAGNOSIS — R7303 Prediabetes: Secondary | ICD-10-CM | POA: Diagnosis not present

## 2024-10-16 DIAGNOSIS — R011 Cardiac murmur, unspecified: Secondary | ICD-10-CM | POA: Diagnosis not present

## 2024-10-16 DIAGNOSIS — Z136 Encounter for screening for cardiovascular disorders: Secondary | ICD-10-CM

## 2024-10-16 DIAGNOSIS — M65949 Unspecified synovitis and tenosynovitis, unspecified hand: Secondary | ICD-10-CM | POA: Insufficient documentation

## 2024-10-16 LAB — CBC WITH DIFFERENTIAL/PLATELET
Basophils Absolute: 0 K/uL (ref 0.0–0.1)
Basophils Relative: 0.5 % (ref 0.0–3.0)
Eosinophils Absolute: 0.1 K/uL (ref 0.0–0.7)
Eosinophils Relative: 1.9 % (ref 0.0–5.0)
HCT: 37.9 % — ABNORMAL LOW (ref 39.0–52.0)
Hemoglobin: 13.1 g/dL (ref 13.0–17.0)
Lymphocytes Relative: 16.9 % (ref 12.0–46.0)
Lymphs Abs: 1.1 K/uL (ref 0.7–4.0)
MCHC: 34.6 g/dL (ref 30.0–36.0)
MCV: 92.7 fl (ref 78.0–100.0)
Monocytes Absolute: 0.7 K/uL (ref 0.1–1.0)
Monocytes Relative: 11.7 % (ref 3.0–12.0)
Neutro Abs: 4.4 K/uL (ref 1.4–7.7)
Neutrophils Relative %: 69 % (ref 43.0–77.0)
Platelets: 178 K/uL (ref 150.0–400.0)
RBC: 4.09 Mil/uL — ABNORMAL LOW (ref 4.22–5.81)
RDW: 13 % (ref 11.5–15.5)
WBC: 6.4 K/uL (ref 4.0–10.5)

## 2024-10-16 LAB — LIPID PANEL
Cholesterol: 155 mg/dL (ref 28–200)
HDL: 40 mg/dL
LDL Cholesterol: 98 mg/dL (ref 10–99)
NonHDL: 114.85
Total CHOL/HDL Ratio: 4
Triglycerides: 82 mg/dL (ref 10.0–149.0)
VLDL: 16.4 mg/dL (ref 0.0–40.0)

## 2024-10-16 LAB — HEMOGLOBIN A1C: Hgb A1c MFr Bld: 5.8 % (ref 4.6–6.5)

## 2024-10-16 LAB — BASIC METABOLIC PANEL WITH GFR
BUN: 24 mg/dL — ABNORMAL HIGH (ref 6–23)
CO2: 25 meq/L (ref 19–32)
Calcium: 8.9 mg/dL (ref 8.4–10.5)
Chloride: 103 meq/L (ref 96–112)
Creatinine, Ser: 1.13 mg/dL (ref 0.40–1.50)
GFR: 64.85 mL/min
Glucose, Bld: 109 mg/dL — ABNORMAL HIGH (ref 70–99)
Potassium: 4.3 meq/L (ref 3.5–5.1)
Sodium: 136 meq/L (ref 135–145)

## 2024-10-16 MED ORDER — DOXAZOSIN MESYLATE 8 MG PO TABS: 8.0000 mg | ORAL_TABLET | Freq: Every day | ORAL | 3 refills | Status: AC

## 2024-10-16 MED ORDER — LISINOPRIL-HYDROCHLOROTHIAZIDE 20-12.5 MG PO TABS: 1.0000 | ORAL_TABLET | Freq: Two times a day (BID) | ORAL | 3 refills | Status: AC

## 2024-10-16 MED ORDER — NALTREXONE-BUPROPION HCL ER 8-90 MG PO TB12: ORAL_TABLET | ORAL | 0 refills | Status: AC

## 2024-10-16 MED ORDER — DILTIAZEM HCL ER COATED BEADS 180 MG PO CP24: 180.0000 mg | ORAL_CAPSULE | Freq: Every day | ORAL | 0 refills | Status: AC

## 2024-10-16 MED ORDER — ATENOLOL 50 MG PO TABS: 100.0000 mg | ORAL_TABLET | Freq: Every day | ORAL | 0 refills | Status: AC

## 2024-10-16 NOTE — Patient Instructions (Addendum)
 Thank you for visiting Marcus Williamson today! Here's what we talked about: - Call Pulmonary office if you do not hear from them in 2 weeks  - START contrave 

## 2024-10-16 NOTE — Progress Notes (Signed)
 "  Subjective:   This visit was conducted in person. The patient gave informed consent to the use of Abridge AI technology to record the contents of the encounter as documented below.   Patient ID: Marcus Williamson, male    DOB: 1951-07-21, 74 y.o.   MRN: [id number]   Discussed the use of AI scribe software for clinical note transcription with the patient, who gave verbal consent to proceed.  History of Present Illness Marcus Williamson is a 74 year old male with hypertension and sleep apnea who presents with concerns about weight loss and trigger finger.  He experiences trigger finger affecting his little finger. The condition developed after changing trucks about a year ago, possibly linked to steering wheel grip. Sleeping with his hand flat improves symptoms, while sleeping with a clenched fist worsens them. He has ordered a splint to try before considering more invasive options.  He has lost some weight but feels he has plateaued. He has not previously been on any weight management medications or followed a weight management clinic. He is interested in exploring weight loss options, particularly GLP-1 medications, and is open to starting oral medications in the meantime.  He has a history of hypertension, currently managed with atenolol , diltiazem , doxazosin , and lisinopril  hydrochlorothiazide . 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 He is concerned about any changes to his medication regimen that might affect his blood pressure control.  He has a history of sleep apnea, for which he uses a CPAP machine. He has been on CPAP for over twenty years and reports significant improvement in his symptoms since starting treatment. He describes severe symptoms prior to CPAP use, including falling asleep unexpectedly and experiencing heart problems.  He has a history of a heart murmur, which  was identified by a previous doctor. An echocardiogram was ordered but never completed due to scheduling issues. No symptoms such as lightheadedness, dizziness, shortness of breath, chest pain, palpitations, or feeling like he is about to pass out.  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 The condition has not worsened but has not improved either.      Review of Systems  All other systems reviewed and are negative.       Allergies[1]  Medications Ordered Prior to Encounter[2]  BP 138/88 (BP Location: Left Arm, Patient Position: Sitting, Cuff Size: Large)   Pulse (!) 48   Temp 98.1 F (36.7 C) (Oral)   Ht 5' 10 (1.778 m)   Wt (!) 326 lb (147.9 kg)   SpO2 95%   BMI 46.78 kg/m   Objective:      Physical Exam GENERAL: Alert, cooperative, well developed, no acute distress.  Obese HEAD: Normocephalic atraumatic. EYES: Extraocular movements intact bilaterally, pupils round, equal and reactive to light bilaterally, conjunctivae normal bilaterally. CARDIOVASCULAR: Normal heart rate and rhythm, S1 and S2 normal, systolic murmur. CHEST: Clear to auscultation bilaterally, no wheezes, rhonchi, or crackles. EXTREMITIES: No cyanosis or edema. NEUROLOGICAL: Oriented to person, place and time, no gait abnormalities, moves all extremities without gross motor or sensory deficit. MUSCULOSKELETAL: Left little finger with locking or catching upon opening from fist, small, firm nodule palpated over flexor tendon.         Assessment & Plan:   Assessment & Plan Morbid obesity Significant weight loss from 340 pounds to 326 pounds  over the past 1 year, more recently patient feels he has hit a plateau. Discussed GLP-1 receptor agonists, noting injectables' efficacy. Insurance may require sleep apnea documentation.  Will repeat sleep study to advocate for insurance coverage with GLP, in the  meantime, will start with oral, Contrave  preferred over phentermine due to lower blood pressure risk.  - Referred to pulmonology for sleep study for insurance coverage of GLP-1 receptor agonists. - Prescribed Contrave  with titration: 1 tablet AM for 7 days, then 1 tablet BID for 7 days, then 2 tablets AM and 1 PM for 4 days, then 2 tablets BID.  Counseled on side effects, agreeable to start - Advised to take Contrave  with food to minimize side effects. - Instructed to contact provider if insurance does not cover Contrave .   Primary hypertension Hypertension managed with atenolol , diltiazem , doxazosin , and lisinopril -hydrochlorothiazide . Blood pressure well-controlled, 138/88 this visit.  Concerns about polypharmacy and lack of true indications for Cardizem  and atenolol  given no history of arrhythmias/palpitations.  Not agreeable to change regimen given concerns of failing his upcoming DOT physical.  Reassess medication regimen post-DOT physical.  - Continue current antihypertensive regimen with 90-day supply for lisinopril -hydrochlorothiazide  and doxazosin . - Prescribed 30-day supply for diltiazem  and atenolol  with potential discontinuation post-DOT physical. - Scheduled follow-up in 4 weeks to reassess blood pressure and medication regimen. - BMP to monitor renal function and potassium  Benign prostatic hyperplasia with lower urinary tract symptoms Benign prostatic hyperplasia with nocturia managed with doxazosin .  No changes at this time. - Continue doxazosin  with 90-day supply.  Obstructive sleep apnea Long-standing obstructive sleep apnea managed with CPAP. No recent sleep study available. - Referred to pulmonology for sleep study to assess severity of sleep apnea for GLP coverage.  Prediabetes Screening for cardiovascular condition Monitoring.  Most recent A1c 6.1 a year ago - Ordered A1c test to monitor glucose levels. - Lipid panel ordered  Cardiac murmur Previously noted  cardiac murmur. No echocardiogram completed.  Asymptomatic, echo reordered. - Reordered echocardiogram to assess cardiac murmur. - Scheduled echocardiogram at [facility].  Flexor tenosynovitis, left little finger Trigger finger likely related to steering wheel grip. Symptoms improve with hand positioning. Splint ordered. Discussed steroid injection if symptoms worsen. - Use splint to maintain finger extension. - Monitor symptoms and return if condition worsens. - Consider steroid injection if no improvement with splinting.  Thrombocytopenia Per chart review CBC last year showed low platelets at 136, they have been low at similar numbers even up to 5 or 6 years ago, will repeat CBC and go from there.    Return in about 4 weeks (around 11/13/2024) for CPE and blood pressure, no fasting labs.   Desera Graffeo K Piper Hassebrock, MD  10/16/24     Contains text generated by Abridge.        [1] No Known Allergies [2]  No current outpatient medications on file prior to visit.   No current facility-administered medications on file prior to visit.   "

## 2024-10-19 ENCOUNTER — Ambulatory Visit: Admitting: Sleep Medicine

## 2024-10-22 ENCOUNTER — Ambulatory Visit: Payer: Self-pay

## 2024-10-22 DIAGNOSIS — E7849 Other hyperlipidemia: Secondary | ICD-10-CM

## 2024-10-23 DIAGNOSIS — E7849 Other hyperlipidemia: Secondary | ICD-10-CM | POA: Insufficient documentation

## 2024-10-23 MED ORDER — ATORVASTATIN CALCIUM 20 MG PO TABS: 20.0000 mg | ORAL_TABLET | Freq: Every evening | ORAL | 3 refills | Status: AC

## 2024-10-27 ENCOUNTER — Ambulatory Visit: Admitting: Sleep Medicine

## 2024-10-27 ENCOUNTER — Other Ambulatory Visit: Payer: Self-pay

## 2024-10-27 ENCOUNTER — Emergency Department
Admission: EM | Admit: 2024-10-27 | Discharge: 2024-10-27 | Disposition: A | Discharge status: Home / Self Care | Source: Ambulatory Visit | Attending: Emergency Medicine | Admitting: Emergency Medicine

## 2024-10-27 ENCOUNTER — Encounter: Payer: Self-pay | Admitting: Emergency Medicine

## 2024-10-27 ENCOUNTER — Ambulatory Visit: Admission: RE | Admit: 2024-10-27 | Discharge: 2024-10-27 | Disposition: A | Source: Ambulatory Visit

## 2024-10-27 ENCOUNTER — Emergency Department

## 2024-10-27 DIAGNOSIS — R011 Cardiac murmur, unspecified: Secondary | ICD-10-CM | POA: Diagnosis present

## 2024-10-27 DIAGNOSIS — I10 Essential (primary) hypertension: Secondary | ICD-10-CM | POA: Diagnosis not present

## 2024-10-27 DIAGNOSIS — I352 Nonrheumatic aortic (valve) stenosis with insufficiency: Secondary | ICD-10-CM | POA: Insufficient documentation

## 2024-10-27 DIAGNOSIS — I3481 Nonrheumatic mitral (valve) annulus calcification: Secondary | ICD-10-CM | POA: Insufficient documentation

## 2024-10-27 DIAGNOSIS — I517 Cardiomegaly: Secondary | ICD-10-CM | POA: Insufficient documentation

## 2024-10-27 DIAGNOSIS — I7121 Aneurysm of the ascending aorta, without rupture: Secondary | ICD-10-CM | POA: Insufficient documentation

## 2024-10-27 DIAGNOSIS — R9431 Abnormal electrocardiogram [ECG] [EKG]: Secondary | ICD-10-CM | POA: Diagnosis present

## 2024-10-27 LAB — ECHOCARDIOGRAM COMPLETE
AR max vel: 1.52 cm2
AV Area VTI: 1.71 cm2
AV Area mean vel: 1.54 cm2
AV Mean grad: 17 mmHg
AV Peak grad: 32 mmHg
Ao pk vel: 2.83 m/s
Area-P 1/2: 2.66 cm2
S' Lateral: 4.5 cm

## 2024-10-27 LAB — CBC
HCT: 38.7 % — ABNORMAL LOW (ref 39.0–52.0)
Hemoglobin: 13.1 g/dL (ref 13.0–17.0)
MCH: 31.1 pg (ref 26.0–34.0)
MCHC: 33.9 g/dL (ref 30.0–36.0)
MCV: 91.9 fL (ref 80.0–100.0)
Platelets: 164 10*3/uL (ref 150–400)
RBC: 4.21 MIL/uL — ABNORMAL LOW (ref 4.22–5.81)
RDW: 12.5 % (ref 11.5–15.5)
WBC: 5.6 10*3/uL (ref 4.0–10.5)
nRBC: 0 % (ref 0.0–0.2)

## 2024-10-27 LAB — BASIC METABOLIC PANEL WITH GFR
Anion gap: 10 (ref 5–15)
BUN: 22 mg/dL (ref 8–23)
CO2: 24 mmol/L (ref 22–32)
Calcium: 9.7 mg/dL (ref 8.9–10.3)
Chloride: 102 mmol/L (ref 98–111)
Creatinine, Ser: 1.17 mg/dL (ref 0.61–1.24)
GFR, Estimated: >60 mL/min
Glucose, Bld: 97 mg/dL (ref 70–99)
Potassium: 4.4 mmol/L (ref 3.5–5.1)
Sodium: 136 mmol/L (ref 135–145)

## 2024-10-27 MED ORDER — IOHEXOL 350 MG/ML SOLN
100.0000 mL | Freq: Once | INTRAVENOUS | Status: AC | PRN
  Administered 2024-10-27: 100 mL via INTRAVENOUS

## 2024-10-27 NOTE — ED Notes (Signed)
 Called [facility] spoke to [place] for transfer to [facility] 1507

## 2024-10-27 NOTE — Discharge Instructions (Signed)
 [facility] Cardiothoracic surgery will call you to schedule visit and prepare for eventual surgery. Return to the ED if any chest pain or discomfort

## 2024-10-27 NOTE — ED Provider Notes (Addendum)
 "  [facility] Provider Note    Event Date/Time   First MD Initiated Contact with Patient 10/27/24 1238     (approximate)   History   Abnormal ECHO  HPI  Marcus Williamson is a 74 y.o. male past medical history significant for hypertension, sleep apnea, who presents to the emergency department for abnormality found on echocardiogram.  Patient was getting an echocardiogram today with cardiology for heart murmur.  Found to have an ascending aortic aneurysm that was measuring at 7.7 cm and sent to the emergency department for CTA and further workup.  Patient is otherwise asymptomatic.  Denies any chest pain or shortness of breath.  No nausea, vomiting or dizziness.  Does not take any anticoagulation.  Last had p.o. last night.     Physical Exam   Triage Vital Signs: ED Triage Vitals  Encounter Vitals Group     BP 10/27/24 1131 (!) 176/114     Girls Systolic BP Percentile --      Girls Diastolic BP Percentile --      Boys Systolic BP Percentile --      Boys Diastolic BP Percentile --      Pulse Rate 10/27/24 1131 (!) 49     Resp 10/27/24 1131 18     Temp 10/27/24 1131 97.6 F (36.4 C)     Temp src --      SpO2 10/27/24 1131 95 %     Weight 10/27/24 1128 (!) 324 lb 1.2 oz (147 kg)     Height 10/27/24 1128 5' 10 (1.778 m)     Head Circumference --      Peak Flow --      Pain Score 10/27/24 1128 0     Pain Loc --      Pain Education --      Exclude from Growth Chart --     Most recent vital signs: Vitals:   10/27/24 1427 10/27/24 1503  BP: (!) 158/100 (!) 155/99  Pulse: (!) 52 60  Resp: 18 18  Temp:    SpO2: 96% 97%    Physical Exam Constitutional:      Appearance: He is well-developed.  HENT:     Head: Atraumatic.  Eyes:     Conjunctiva/sclera: Conjunctivae normal.  Cardiovascular:     Rate and Rhythm: Regular rhythm.     Pulses: Normal pulses.  Pulmonary:     Effort: No respiratory distress.  Musculoskeletal:     Cervical back: Normal  range of motion.     Right lower leg: No edema.     Left lower leg: No edema.  Skin:    General: Skin is warm.     Capillary Refill: Capillary refill takes less than 2 seconds.  Neurological:     Mental Status: He is alert. Mental status is at baseline.  Psychiatric:     Comments: Anxious appearing     IMPRESSION / MDM / ASSESSMENT AND PLAN / ED COURSE  I reviewed the triage vital signs and the nursing notes.  Reviewed echocardiogram report with aortic root dilation and concern for aortic aneurysm of the ascending aorta of 7.7.  Made n.p.o.   RADIOLOGY I independently reviewed imaging, my interpretation of imaging: CTA aorta -aneurysm dilation of the ascending aorta measuring up to 6.9 cm dilated origin to the left subclavian artery measuring 2.2 cm, coronary artery calcifications  LABS (all labs ordered are listed, but only abnormal results are displayed) Labs interpreted as -  Labs Reviewed  CBC - Abnormal; Notable for the following components:      Result Value   RBC 4.21 (*)    HCT 38.7 (*)    All other components within normal limits  BASIC METABOLIC PANEL WITH GFR     MDM  No leukocytosis or anemia.  Creatinine at baseline.  No significant electrolyte abnormality.  Plan for CTA to further evaluate aortic aneurysm  Heart rate currently is 49.  Blood pressure 166/107  Clinical Course as of 10/27/24 1536  Fri Oct 27, 2024  1457 Paging [facility] CT surgery team to discuss asymptomatic aortic ascending aneurysm. [SM]    Clinical Course User Index [SM] Suzanne Kirsch, MD     PROCEDURES:  Critical Care performed: yes  .Critical Care  Performed by: Suzanne Kirsch, MD Authorized by: Suzanne Kirsch, MD   Critical care provider statement:    Critical care time (minutes):  60   Critical care time was exclusive of:  Separately billable procedures and treating other patients   Critical care was necessary to treat or prevent imminent or life-threatening  deterioration of the following conditions:  Cardiac failure   Critical care was time spent personally by me on the following activities:  Development of treatment plan with patient or surrogate, discussions with consultants, evaluation of patient's response to treatment, examination of patient, ordering and review of laboratory studies, ordering and review of radiographic studies, ordering and performing treatments and interventions, pulse oximetry, re-evaluation of patient's condition and review of old charts   Care discussed with: accepting provider at another facility     Patient's presentation is most consistent with acute presentation with potential threat to life or bodily function.   MEDICATIONS ORDERED IN ED: Medications  iohexol  (OMNIPAQUE ) 350 MG/ML injection 100 mL (100 mLs Intravenous Contrast Given 10/27/24 1414)    FINAL CLINICAL IMPRESSION(S) / ED DIAGNOSES   Final diagnoses:  Aneurysm of ascending aorta without rupture     Rx / DC Orders   ED Discharge Orders     None        Note:  This document was prepared using Dragon voice recognition software and may include unintentional dictation errors.   Suzanne Kirsch, MD 10/27/24 8541    Suzanne Kirsch, MD 10/27/24 1536  "

## 2024-10-27 NOTE — ED Provider Notes (Addendum)
 Spoke to [facility] cardiothoracic surgeon Dr. Geraldene Pock who has reviewed the images and feels comfortable with outpatient follow-up.  Discussed with patient who is also comfortable with this plan.   [name], MD 10/27/24 1635    [name], MD 10/27/24 [phone]

## 2024-10-27 NOTE — ED Notes (Signed)
 Patient had cardiac echo performed today and was sent to ED by provider to be admitted to the hospital. Provider reportedly spoke with ED physcians.

## 2024-11-03 ENCOUNTER — Other Ambulatory Visit: Payer: Self-pay

## 2024-11-03 DIAGNOSIS — I10 Essential (primary) hypertension: Secondary | ICD-10-CM
